# Patient Record
Sex: Female | Born: 1960 | Race: White | Hispanic: No | Smoking: Never smoker
Health system: Southern US, Community
[De-identification: ages and names within clinical notes are randomized; demographics above are authoritative.]

## PROBLEM LIST (undated history)

## (undated) DIAGNOSIS — Z9889 Other specified postprocedural states: Secondary | ICD-10-CM

## (undated) DIAGNOSIS — Z98811 Dental restoration status: Secondary | ICD-10-CM

## (undated) DIAGNOSIS — R112 Nausea with vomiting, unspecified: Secondary | ICD-10-CM

## (undated) DIAGNOSIS — T7840XA Allergy, unspecified, initial encounter: Secondary | ICD-10-CM

## (undated) DIAGNOSIS — R011 Cardiac murmur, unspecified: Secondary | ICD-10-CM

## (undated) DIAGNOSIS — K219 Gastro-esophageal reflux disease without esophagitis: Secondary | ICD-10-CM

## (undated) DIAGNOSIS — I341 Nonrheumatic mitral (valve) prolapse: Secondary | ICD-10-CM

## (undated) DIAGNOSIS — R0981 Nasal congestion: Secondary | ICD-10-CM

## (undated) DIAGNOSIS — J381 Polyp of vocal cord and larynx: Secondary | ICD-10-CM

## (undated) DIAGNOSIS — M5412 Radiculopathy, cervical region: Secondary | ICD-10-CM

## (undated) DIAGNOSIS — M1990 Unspecified osteoarthritis, unspecified site: Secondary | ICD-10-CM

## (undated) DIAGNOSIS — H01133 Eczematous dermatitis of right eye, unspecified eyelid: Secondary | ICD-10-CM

## (undated) HISTORY — DX: Nonrheumatic mitral (valve) prolapse: I34.1

## (undated) HISTORY — DX: Radiculopathy, cervical region: M54.12

## (undated) HISTORY — DX: Allergy, unspecified, initial encounter: T78.40XA

## (undated) HISTORY — DX: Gastro-esophageal reflux disease without esophagitis: K21.9

## (undated) HISTORY — DX: Cardiac murmur, unspecified: R01.1

## (undated) HISTORY — PX: FINGER SURGERY: SHX640

## (undated) HISTORY — DX: Unspecified osteoarthritis, unspecified site: M19.90

## (undated) HISTORY — PX: MOUTH SURGERY: SHX715

---

## 1987-01-28 HISTORY — PX: LUMBAR DISC SURGERY: SHX700

## 1999-03-13 ENCOUNTER — Inpatient Hospital Stay (HOSPITAL_COMMUNITY): Admission: AD | Admit: 1999-03-13 | Discharge: 1999-03-15 | Payer: Self-pay | Admitting: *Deleted

## 2000-09-04 ENCOUNTER — Ambulatory Visit (HOSPITAL_COMMUNITY): Admission: RE | Admit: 2000-09-04 | Discharge: 2000-09-04 | Payer: Self-pay | Admitting: *Deleted

## 2000-09-04 ENCOUNTER — Encounter: Payer: Self-pay | Admitting: *Deleted

## 2001-05-25 ENCOUNTER — Other Ambulatory Visit: Admission: RE | Admit: 2001-05-25 | Discharge: 2001-05-25 | Payer: Self-pay | Admitting: *Deleted

## 2003-10-09 ENCOUNTER — Ambulatory Visit (HOSPITAL_COMMUNITY): Admission: RE | Admit: 2003-10-09 | Discharge: 2003-10-09 | Payer: Self-pay | Admitting: Obstetrics & Gynecology

## 2003-11-07 ENCOUNTER — Encounter (INDEPENDENT_AMBULATORY_CARE_PROVIDER_SITE_OTHER): Payer: Self-pay | Admitting: *Deleted

## 2003-11-07 ENCOUNTER — Ambulatory Visit (HOSPITAL_BASED_OUTPATIENT_CLINIC_OR_DEPARTMENT_OTHER): Admission: RE | Admit: 2003-11-07 | Discharge: 2003-11-07 | Payer: Self-pay | Admitting: Otolaryngology

## 2003-11-07 ENCOUNTER — Ambulatory Visit (HOSPITAL_COMMUNITY): Admission: RE | Admit: 2003-11-07 | Discharge: 2003-11-07 | Payer: Self-pay | Admitting: Otolaryngology

## 2003-11-07 HISTORY — PX: MICROLARYNGOSCOPY: SHX5208

## 2004-03-22 ENCOUNTER — Ambulatory Visit: Payer: Self-pay | Admitting: Family Medicine

## 2004-11-06 ENCOUNTER — Ambulatory Visit: Payer: Self-pay | Admitting: Family Medicine

## 2004-12-27 ENCOUNTER — Encounter: Admission: RE | Admit: 2004-12-27 | Discharge: 2004-12-27 | Payer: Self-pay | Admitting: Obstetrics & Gynecology

## 2005-01-16 ENCOUNTER — Encounter: Admission: RE | Admit: 2005-01-16 | Discharge: 2005-01-16 | Payer: Self-pay | Admitting: Obstetrics & Gynecology

## 2005-04-17 ENCOUNTER — Ambulatory Visit: Payer: Self-pay | Admitting: Family Medicine

## 2005-10-01 ENCOUNTER — Ambulatory Visit: Payer: Self-pay | Admitting: Family Medicine

## 2005-12-12 ENCOUNTER — Ambulatory Visit: Payer: Self-pay | Admitting: Cardiology

## 2005-12-24 ENCOUNTER — Ambulatory Visit: Payer: Self-pay

## 2005-12-24 ENCOUNTER — Encounter: Payer: Self-pay | Admitting: Cardiovascular Disease

## 2006-01-22 ENCOUNTER — Encounter: Admission: RE | Admit: 2006-01-22 | Discharge: 2006-01-22 | Payer: Self-pay | Admitting: Obstetrics & Gynecology

## 2006-02-02 ENCOUNTER — Ambulatory Visit: Payer: Self-pay | Admitting: Family Medicine

## 2006-02-02 ENCOUNTER — Encounter: Admission: RE | Admit: 2006-02-02 | Discharge: 2006-02-02 | Payer: Self-pay | Admitting: Obstetrics & Gynecology

## 2006-02-09 ENCOUNTER — Ambulatory Visit: Payer: Self-pay | Admitting: Family Medicine

## 2006-02-25 ENCOUNTER — Ambulatory Visit: Payer: Self-pay | Admitting: Family Medicine

## 2006-06-30 ENCOUNTER — Ambulatory Visit: Payer: Self-pay | Admitting: Family Medicine

## 2006-06-30 DIAGNOSIS — M545 Low back pain: Secondary | ICD-10-CM

## 2006-06-30 DIAGNOSIS — Z8679 Personal history of other diseases of the circulatory system: Secondary | ICD-10-CM | POA: Insufficient documentation

## 2006-07-03 ENCOUNTER — Ambulatory Visit: Payer: Self-pay | Admitting: Family Medicine

## 2006-07-03 DIAGNOSIS — J309 Allergic rhinitis, unspecified: Secondary | ICD-10-CM | POA: Insufficient documentation

## 2006-08-03 ENCOUNTER — Encounter: Admission: RE | Admit: 2006-08-03 | Discharge: 2006-08-03 | Payer: Self-pay | Admitting: Obstetrics & Gynecology

## 2006-10-12 ENCOUNTER — Ambulatory Visit: Payer: Self-pay | Admitting: Family Medicine

## 2006-10-12 DIAGNOSIS — M79609 Pain in unspecified limb: Secondary | ICD-10-CM

## 2006-10-13 ENCOUNTER — Telehealth (INDEPENDENT_AMBULATORY_CARE_PROVIDER_SITE_OTHER): Payer: Self-pay | Admitting: *Deleted

## 2006-10-15 ENCOUNTER — Encounter: Payer: Self-pay | Admitting: Family Medicine

## 2006-12-02 ENCOUNTER — Ambulatory Visit: Payer: Self-pay | Admitting: Cardiology

## 2007-01-27 ENCOUNTER — Telehealth (INDEPENDENT_AMBULATORY_CARE_PROVIDER_SITE_OTHER): Payer: Self-pay | Admitting: *Deleted

## 2007-02-11 ENCOUNTER — Telehealth (INDEPENDENT_AMBULATORY_CARE_PROVIDER_SITE_OTHER): Payer: Self-pay | Admitting: *Deleted

## 2007-03-09 ENCOUNTER — Encounter: Admission: RE | Admit: 2007-03-09 | Discharge: 2007-03-09 | Payer: Self-pay | Admitting: Obstetrics & Gynecology

## 2008-03-08 ENCOUNTER — Ambulatory Visit: Payer: Self-pay | Admitting: Family Medicine

## 2008-03-08 DIAGNOSIS — M5412 Radiculopathy, cervical region: Secondary | ICD-10-CM | POA: Insufficient documentation

## 2008-03-08 DIAGNOSIS — M542 Cervicalgia: Secondary | ICD-10-CM

## 2008-03-16 ENCOUNTER — Encounter: Admission: RE | Admit: 2008-03-16 | Discharge: 2008-03-16 | Payer: Self-pay | Admitting: Obstetrics & Gynecology

## 2008-04-18 ENCOUNTER — Encounter: Admission: RE | Admit: 2008-04-18 | Discharge: 2008-04-18 | Payer: Self-pay | Admitting: Family Medicine

## 2008-04-24 ENCOUNTER — Telehealth (INDEPENDENT_AMBULATORY_CARE_PROVIDER_SITE_OTHER): Payer: Self-pay | Admitting: *Deleted

## 2008-04-24 ENCOUNTER — Encounter (INDEPENDENT_AMBULATORY_CARE_PROVIDER_SITE_OTHER): Payer: Self-pay | Admitting: *Deleted

## 2008-05-05 ENCOUNTER — Encounter (INDEPENDENT_AMBULATORY_CARE_PROVIDER_SITE_OTHER): Payer: Self-pay | Admitting: *Deleted

## 2009-03-26 ENCOUNTER — Encounter: Admission: RE | Admit: 2009-03-26 | Discharge: 2009-03-26 | Payer: Self-pay | Admitting: Obstetrics & Gynecology

## 2009-10-10 LAB — CONVERTED CEMR LAB: Pap Smear: NORMAL

## 2009-11-09 ENCOUNTER — Encounter: Payer: Self-pay | Admitting: Family Medicine

## 2009-11-09 ENCOUNTER — Ambulatory Visit: Payer: Self-pay | Admitting: Family Medicine

## 2009-11-09 ENCOUNTER — Encounter (INDEPENDENT_AMBULATORY_CARE_PROVIDER_SITE_OTHER): Payer: Self-pay | Admitting: *Deleted

## 2009-11-09 DIAGNOSIS — M1990 Unspecified osteoarthritis, unspecified site: Secondary | ICD-10-CM | POA: Insufficient documentation

## 2009-11-09 DIAGNOSIS — R5383 Other fatigue: Secondary | ICD-10-CM

## 2009-11-09 DIAGNOSIS — R5381 Other malaise: Secondary | ICD-10-CM | POA: Insufficient documentation

## 2009-11-12 ENCOUNTER — Encounter: Payer: Self-pay | Admitting: Family Medicine

## 2009-11-19 LAB — CONVERTED CEMR LAB
ALT: 10 units/L (ref 0–35)
AST: 18 units/L (ref 0–37)
Alkaline Phosphatase: 57 units/L (ref 39–117)
Basophils Relative: 0 % (ref 0–1)
CO2: 27 meq/L (ref 19–32)
Chloride: 101 meq/L (ref 96–112)
Creatinine, Ser: 0.89 mg/dL (ref 0.40–1.20)
Eosinophils Absolute: 0.1 10*3/uL (ref 0.0–0.7)
Folate: >20 ng/mL
Glucose, Bld: 82 mg/dL (ref 70–99)
HCT: 39.8 % (ref 36.0–46.0)
Lymphs Abs: 1.7 10*3/uL (ref 0.7–4.0)
MCV: 91.9 fL (ref 78.0–100.0)
Monocytes Relative: 8 % (ref 3–12)
Neutro Abs: 4.9 10*3/uL (ref 1.7–7.7)
Platelets: 196 10*3/uL (ref 150–400)
Potassium: 4.2 meq/L (ref 3.5–5.3)
RBC: 4.33 M/uL (ref 3.87–5.11)
RDW: 12.9 % (ref 11.5–15.5)
Sodium: 138 meq/L (ref 135–145)
Vit D, 25-Hydroxy: 39 ng/mL (ref 30–89)
Vitamin B-12: 709 pg/mL (ref 211–911)

## 2009-12-13 ENCOUNTER — Ambulatory Visit: Payer: Self-pay | Admitting: Cardiology

## 2009-12-13 ENCOUNTER — Encounter: Payer: Self-pay | Admitting: Family Medicine

## 2009-12-25 ENCOUNTER — Encounter: Payer: Self-pay | Admitting: Cardiology

## 2009-12-25 ENCOUNTER — Ambulatory Visit: Payer: Self-pay | Admitting: Internal Medicine

## 2009-12-25 ENCOUNTER — Ambulatory Visit (HOSPITAL_COMMUNITY): Admission: RE | Admit: 2009-12-25 | Discharge: 2009-12-25 | Payer: Self-pay | Admitting: Cardiology

## 2009-12-25 ENCOUNTER — Ambulatory Visit: Payer: Self-pay

## 2010-01-01 ENCOUNTER — Telehealth: Payer: Self-pay | Admitting: Cardiology

## 2010-02-16 ENCOUNTER — Encounter: Payer: Self-pay | Admitting: Obstetrics & Gynecology

## 2010-02-17 ENCOUNTER — Encounter: Payer: Self-pay | Admitting: Obstetrics & Gynecology

## 2010-02-28 NOTE — Letter (Signed)
Summary: [facility]: Immunoassay Fecal Occult Blood (iFOB) Order Form  [facility] [place]  [address] [place], [place] [address]   Phone: [phone]  Fax: [phone]      [facility]: Immunoassay Fecal Occult Blood (iFOB) Order Form   November 12, 2009 MRN: [id number]   Laura [name] Mar 18, 1960   Physicican Name: Laury Axon  Diagnosis Code: v70.0 and 780.79      Loreen Freud, DO

## 2010-02-28 NOTE — Assessment & Plan Note (Signed)
Summary: EC6/HX of MVP/Former pt Dr.Mcdowell-MB    Visit Type:  Initial Consult Primary Provider:  Dr. Laury Axon   History of Present Illness: 50 yo with history of mitral valve prolapse previously seen in the office by Dr. Diona Browner returns for cardiac evaluation.  She has been doing well in general.  She has occasional palpitations that have been present for a number of years with no change.  These have been thought to be due to premature beats.  She gets mildly short of breath if she walks fast up a flight of steps.  No shortness of breath walking on flat ground.  No chest pain.  Occasional vertigo-type symptoms.  Last echo in 2007 did not show any significant mitral regurgitation.    Labs (10/11): LDL 104, HDL 47, TSH normal, HCT 39.8  Current Medications (verified): 1)  Denavir 1 % Crea (Penciclovir) .... Apply A Small Amount To Affected Area As Directed 2)  Mvi .... Daily 3)  Advil .... By Mouth As Needed 4)  Glucosamine-Chondroitin  Caps (Glucosamine-Chondroit-Vit C-Mn) .... Daily  Allergies: 1)  ! Erythromycin  Past History:  Past Medical History: 1. Osteoarthritis 2. CERVICAL RADICULOPATHY (ICD-723.4) 3. RHINITIS, ALLERGIC NOS (ICD-477.9) 4. BACK PAIN, LUMBAR (ICD-724.2) 5. MITRAL VALVE PROLAPSE, HX OF (ICD-V12.50): last echo 11/07 with EF 55%, no significant mitral regurgitation.  6. Palpitations  Family History: Family History Breast Cancer FAmily History Diabetes Mother--tongue cancer Family History Thyroid disease Father with CAD in early 54s, CABG late 58s  Social History: Occupation: IT consultant Married, originally from [place].  Never Smoked Alcohol use-yes Drug use-no Regular exercise-yes  Review of Systems       All systems reviewed and negative except as per HPI.   Vital Signs:  Patient profile:   50 year old female Height:      68.5 inches Weight:      160 pounds BMI:     24.06 Pulse rate:   70 / minute BP sitting:   107 / 67   (left arm)  Vitals Entered By: Laurance Flatten CMA (December 13, 2009 8:52 AM)  Physical Exam  General:  Well developed, well nourished, in no acute distress. Head:  normocephalic and atraumatic Nose:  no deformity, discharge, inflammation, or lesions Mouth:  Teeth, gums and palate normal. Oral mucosa normal. Neck:  Neck supple, no JVD. No masses, thyromegaly or abnormal cervical nodes. Lungs:  Clear bilaterally to auscultation and percussion. Heart:  Non-displaced PMI, chest non-tender; regular rate and rhythm, S1, S2 without murmurs, rubs or gallops. Carotid upstroke normal, no bruit. Pedals normal pulses. No edema, no varicosities. Abdomen:  Bowel sounds positive; abdomen soft and non-tender without masses, organomegaly, or hernias noted. No hepatosplenomegaly. Extremities:  No clubbing or cyanosis. Neurologic:  Alert and oriented x 3. Psych:  Normal affect.   Impression & Recommendations:  Problem # 1:  PALPITATIONS Occasional mild palpitations, suspect premature beats.     Problem # 2:  MITRAL VALVE PROLAPSE, HX OF (ICD-V12.50) No significant murmur on exam.  Will get echo to followup MVP.   Follow in office in 3 years if echo unremarkable.   Other Orders: Echocardiogram (Echo)  Patient Instructions: 1)  Your physician has requested that you have an echocardiogram.  Echocardiography is a painless test that uses sound waves to create images of your heart. It provides your doctor with information about the size and shape of your heart and how well your heart's chambers and valves are working.  This procedure takes  approximately one hour. There are no restrictions for this procedure. 2)  Your physician wants you to follow-up in:  3  years with Dr Shirlee Latch.  You will receive a reminder letter in the mail two months in advance. If you don't receive a letter, please call our office to schedule the follow-up appointment.

## 2010-02-28 NOTE — Assessment & Plan Note (Signed)
Summary: CPX ---WILL  TRY TO FAST---NEEDS TSH CHECKED PER OB-GYN///SPH   Vital Signs:  Patient profile:   50 year old female Height:      68.5 inches Weight:      157.38 pounds (71.54 kg) BMI:     23.67 Temp:     98.4 degrees F (36.89 degrees C) oral BP sitting:   112 / 70  (right arm) Cuff size:   regular  Vitals Entered By: Lucious Groves CMA (November 09, 2009 1:07 PM) CC: Fasting CPX/no pap./kb Comments Patient notes that she has already had her pap and mammorgram for the year. Patient needs repeat TSH./kb   History of Present Illness: Pt here for cpe and labs.   No pap.   Pt c/o gaining a lot of weight this year.  TSH done at gyn office and it was a little low.   Preventive Screening-Counseling & Management  Alcohol-Tobacco     Alcohol drinks/day: <1     Smoking Status: never  Caffeine-Diet-Exercise     Caffeine use/day: 3     Does Patient Exercise: yes  Hep-HIV-STD-Contraception     Dental Visit-last 6 months yes     Dental Care Counseling: to seek dental care; no dental care within six months     SBE monthly: no     SBE Education/Counseling: to perform regular SBE      Sexual History:  currently monogamous and married.        Drug Use:  no.    Current Medications (verified): 1)  Denavir 1 % Crea (Penciclovir) .... Apply A Small Amount To Affected Area As Directed 2)  Mvi .... Daily 3)  Advil .... By Mouth As Needed 4)  Glucosamine-Chondroitin  Caps (Glucosamine-Chondroit-Vit C-Mn) .... Daily  Allergies (verified): 1)  ! 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 Eyes:  Denies blurring, discharge, double vision, eye irritation, eye pain, halos, itching, light sensitivity, red eye, vision loss-1 eye, and vision loss-both eyes; optho--q1y. ENT:  Denies decreased hearing, difficulty swallowing, ear discharge, earache, hoarseness, nasal congestion, nosebleeds, postnasal drainage, ringing in ears, sinus pressure, and sore throat. CV:  Denies bluish discoloration of lips or nails, chest pain or discomfort, difficulty breathing at night, difficulty breathing while lying down, fainting, fatigue, leg cramps with exertion, lightheadness, near fainting, palpitations, shortness of breath with exertion, swelling of feet, swelling of hands, and weight gain. Resp:  Denies chest discomfort, chest pain with inspiration, cough, coughing up blood, excessive snoring, hypersomnolence, morning headaches, pleuritic, shortness of breath, sputum productive, and wheezing. GI:  Denies abdominal pain, bloody stools, change in bowel habits, constipation, dark tarry stools, diarrhea, excessive appetite, gas, hemorrhoids, indigestion,  loss of appetite, and nausea. GU:  Denies abnormal vaginal bleeding, decreased libido, discharge, dysuria, genital sores, hematuria, incontinence, nocturia, urinary frequency, and urinary hesitancy. MS:  Complains of joint pain; denies joint redness, joint swelling, loss of strength, low  back pain, mid back pain, muscle aches, muscle , cramps, muscle weakness, stiffness, and thoracic pain. Derm:  Denies changes in color of skin, changes in nail beds, dryness, excessive perspiration, flushing, hair loss, insect bite(s), itching, lesion(s), poor wound healing, and rash. Neuro:  Denies brief paralysis, difficulty with concentration, disturbances in coordination, falling down, headaches, inability to speak, memory loss, numbness, poor balance, seizures, sensation of room spinning, tingling, tremors, visual disturbances, and weakness. Psych:  Denies alternate hallucination ( auditory/visual), anxiety, depression, easily angered, easily tearful, irritability, mental problems, panic attacks, sense of great danger, suicidal thoughts/plans, thoughts of violence, unusual visions or sounds, and thoughts /plans of harming others. Endo:  Denies cold intolerance, excessive hunger, excessive thirst, excessive urination, heat intolerance, polyuria, and weight change. Heme:  Denies abnormal bruising, bleeding, enlarge lymph nodes, fevers, pallor, and skin discoloration. Allergy:  Denies hives or rash, itching eyes, persistent infections, seasonal allergies, and sneezing.  Physical Exam  General:  Well-developed,well-nourished,in no acute distress; alert,appropriate and cooperative throughout examination Head:  Normocephalic and atraumatic without obvious abnormalities. No apparent alopecia or balding. Eyes:  pupils equal, pupils round, pupils reactive to light, and no injection.   Ears:  External ear exam shows no significant lesions or deformities.  Otoscopic examination reveals clear canals, tympanic membranes are intact bilaterally without bulging, retraction, inflammation or discharge. Hearing is grossly normal bilaterally. Nose:  External nasal examination shows no deformity or inflammation. Nasal mucosa are pink and moist without lesions or exudates. Mouth:  Oral mucosa and oropharynx without  lesions or exudates.  Teeth in good repair. Neck:  No deformities, masses, or tenderness noted.no carotid bruits.   Chest Wall:  No deformities, masses, or tenderness noted. Breasts:  gyn Lungs:  Normal respiratory effort, chest expands symmetrically. Lungs are clear to auscultation, no crackles or wheezes. Heart:  normal rate and no murmur.   Abdomen:  Bowel sounds positive,abdomen soft and non-tender without masses, organomegaly or hernias noted. Rectal:  gyn Genitalia:  gyn Msk:  normal ROM, no joint tenderness, no joint swelling, no joint warmth, no redness over joints, no joint deformities, no joint instability, and no crepitation.   Pulses:  R and L carotid,radial,femoral,dorsalis pedis and posterior tibial pulses are full and equal bilaterally Extremities:  No clubbing, cyanosis, edema, or deformity noted with normal full range of motion of all joints.   Neurologic:  No cranial nerve deficits noted. Station and gait are normal. Plantar reflexes are down-going bilaterally. DTRs are symmetrical throughout. Sensory, motor and coordinative functions appear intact. Skin:  Intact without suspicious lesions or rashes Cervical Nodes:  No lymphadenopathy noted Axillary Nodes:  No palpable lymphadenopathy Psych:  Cognition and judgment appear intact. Alert and cooperative with normal attention span and concentration. No apparent delusions, illusions, hallucinations   Impression & Recommendations:  Problem # 1:  PREVENTIVE HEALTH CARE (ICD-V70.0) check labs ghm utd Orders: Venipuncture (62952) Specimen Handling (84132) EKG w/ Interpretation (93000)  Problem # 2:  MALAISE AND FATIGUE (ICD-780.79)  Orders: T-Vitamin D (25-Hydroxy) (44010-27253) Specimen Handling (66440) EKG w/ Interpretation (93000)  Problem # 3:  MITRAL VALVE PROLAPSE, HX OF (ICD-V12.50) Assessment: Improved pt states she is due for cardio f/u Orders: Cardiology Referral (Cardiology) EKG w/ Interpretation  (93000)  Complete Medication List: 1)  Denavir 1 % Crea (Penciclovir) .... Apply a small amount to affected area as directed 2)  Mvi  .... Daily 3)  Advil  .... By mouth as needed 4)  Glucosamine-chondroitin Caps (Glucosamine-chondroit-vit c-mn) .... Daily  Other Orders: Tdap => 23yrs IM [id number]) Admin 1st Vaccine (60454)  Preventive Care Screening  Pap Smear:    Date:  09/27/2009    Results:  historical   Mammogram:    Date:  03/27/2009    Results:  historical    Flu Vaccine Result Date:  10/31/2009 Flu Vaccine Result:  given Flu Vaccine Next Due:  1 yr PAP Result Date:  10/10/2009 PAP Result:  normal PAP Next Due:  1 yr Mammogram Result Date:  04/11/2009 Mammogram Result:  normal Mammogram Next Due:  1 yr     Immunizations Administered:  Tetanus Vaccine:    Vaccine Type: Tdap    Site: right deltoid    Mfr: GlaxoSmithKline    Dose: 0.5 ml    Route: IM    Given by: Lucious Groves CMA    Exp. Date: 10/18/2011    Lot #: [id number]    VIS given: 12/15/07 version given November 09, 2009.

## 2010-02-28 NOTE — Letter (Signed)
Summary: Primary Care Consult Scheduled Letter  [facility] [place]  [address] [place], [place] [address]   Phone: [phone]  Fax: [phone]      11/09/2009 MRN: [id number]  KEYUNA CUTHRELL [address] [place], [place]  [address]    Dear Ms. Rini,    We have scheduled an appointment for you.  At the recommendation of Dr. Loreen Freud, we have scheduled you a consult with Dr. Marca Ancona on 11-29-2009 at 10:30am.  Their address is [address] floor, [place] [address]. The office phone number is [phone].  If this appointment day and time is not convenient for you, please feel free to call the office of the doctor you are being referred to at the number listed above and reschedule the appointment.    It is important for you to keep your scheduled appointments. We are here to make sure you are given good patient care.   Thank you,    Renee, Patient Care Coordinator [place] at [facility]

## 2010-02-28 NOTE — Progress Notes (Signed)
Summary: Pt returning call   Phone Note Call from Patient Call back at [phone]   Summary of Call: Pt returning call Initial call taken by: Judie Grieve,  January 01, 2010 1:16 PM  Follow-up for Phone Call        I notified pt of echo results done 12/25/09

## 2010-02-28 NOTE — Miscellaneous (Signed)
Summary: Appointment Canceled  Appointment status changed to canceled by [employer] on 12/13/2009 9:44 AM.  Cancellation Comments --------------------- echo dx  Appointment Information ----------------------- Appt Type:  CARDIOLOGY ANCILLARY VISIT      Date:  Monday, December 24, 2009      Time:  7:30 AM for 60 min   Urgency:  Routine   Made By:  Pearson Grippe  To Visit:  [id number]    Reason:  echo dx  Appt Comments ------------- -- 12/13/09 9:44: ([facility]) CANCELED -- echo dx -- 12/13/09 9:34: ([facility]) BOOKED -- Routine CARDIOLOGY ANCILLARY VISIT at 12/24/2009 7:30 AM for 60 min echo dx

## 2010-05-21 ENCOUNTER — Telehealth: Payer: Self-pay | Admitting: Family Medicine

## 2010-05-21 NOTE — Telephone Encounter (Signed)
Spoke w/ pt appt scheduled 

## 2010-05-21 NOTE — Telephone Encounter (Signed)
Patient has numbness in fingertips of right hand only (she is rt handed)---has pain that shoots down arm---thinks it might be carpal tunnel

## 2010-05-23 ENCOUNTER — Encounter: Payer: Self-pay | Admitting: Family Medicine

## 2010-05-23 ENCOUNTER — Ambulatory Visit (INDEPENDENT_AMBULATORY_CARE_PROVIDER_SITE_OTHER): Payer: 59 | Admitting: Family Medicine

## 2010-05-23 DIAGNOSIS — G5620 Lesion of ulnar nerve, unspecified upper limb: Secondary | ICD-10-CM

## 2010-05-23 NOTE — Assessment & Plan Note (Signed)
Pt will get wrist splint for wrist Get cushion for computer keyboard and under elbow To ortho if no better nsaids prn

## 2010-05-23 NOTE — Patient Instructions (Signed)
Ulnar Nerve Contusion with Rehab   The ulnar nerve lies near the surface of the skin as it passes by the elbow. This location causes it to be susceptible to injury. An ulnar nerve contusion is a bruise of the nerve. It is the result of direct trauma to the elbow.  Ulnar nerve contusions are characterized by pain, weakness, and loss of feeling in the hand.   SYMPTOMS  Signs of nerve damage include: tingling, numbness, weakness, and/or loss of feeling in the hand, specifically the little finger and ring finger.  Sharp pains that may shoot from the elbow to the wrist and hand.  Decreased hand function.  Tenderness and/ or inflammation in the elbow.  Muscle wasting (atrophy) in the hand.   CAUSES  Ulnar nerve contusions are caused by direct trauma to the elbow that results in bleeding which enters the nerve.   RISK INCREASES WITH.  Contact sports (football, soccer, or rugby).  Bleeding disorders.  Taking blood thinning medicine (warfarin [Coumadin], aspirin, or nonsteroidal anti-inflammatory medications).  Diabetes mellitus.  Underactive thyroid gland (hypothyroidism).   PREVENTIVE MEASURES   Wear properly fitted and padded protective equipment.  Only take blood thinning medication when necessary.    PROGNOSIS Ulnar nerve contusions usually heal within 6 weeks.  Healing often occurs spontaneously, but treatment helps reduce symptoms.    POSSIBLE COMPLICATIONS   Permanent nerve damage, including pain, numbness, tingling, or weakness in the hand (rare).  Weak grip.  Prolonged healing time, if improperly treated or re-injured.   GENERAL TREATMENT CONSIDERATIONS  Treatment initially involves resting from any activities that aggravate the symptoms, and the use of ice and medications to help reduce pain and inflammation. The use of strengthening and stretching exercises may help reduce pain with activity. These exercises may be performed at home or with referral to a  therapist. Your caregiver may recommend that you splint the elbow at night to help healing of the nerve. If symptoms persist despite conservative (non-surgical) treatment, then surgery may be recommended to free the nerve.   MEDICATION   If pain medication is necessary, then nonsteroidal anti-inflammatory medications, such as aspirin and ibuprofen, or other minor pain relievers, such as acetaminophen, are often recommended.   Do not take pain medication within 7 days before surgery.   Prescription pain relievers may be given if deemed necessary by your caregiver. Use only as directed and only as much as you need.   COLD THERAPY   Cold treatment (icing) relieves pain and reduces inflammation. Cold treatment should be applied for 10 to 15 minutes every 2 to 3 hours for inflammation and pain and immediately after any activity that aggravates your symptoms. Use ice packs or massage the area with a piece of ice (ice massage).   SEEK MEDICAL CARE IF:   Treatment seems to offer no benefit, or the condition worsens.  Any medications produce adverse side effects.   EXERCISES    RANGE OF MOTION AND STRETCHING EXERCISES - Ulnar Nerve Contusion These exercises may help you when beginning to rehabilitate your injury.  Do not begin these exercises until your physician, physical therapist or athletic trainer advises you to do so.  Discontinue any exercise that worsens your symptoms.  Contact your physician with instructions on how to continue. Your symptoms may resolve with or without further involvement from your physician, physical therapist or athletic trainer. While completing these exercises, remember:  Restoring tissue flexibility helps normal motion to return to the joints. This allows healthier, less  painful movement and activity.  An effective stretch should be held for at least 30 seconds.  A stretch should never be painful. You should only feel a gentle lengthening or release in the  stretched tissue.      RANGE OF MOTION - Extension  Hold your __________ arm at your side and straighten your elbow as far as you can using your __________ arm muscles.  Straighten the __________ elbow farther by gently pushing down on your forearm until you feel a gentle stretch on the inside of your elbow.  Hold this position for __________ seconds.  Slowly return to the starting position.  Repeat __________ times. Complete this exercise __________ times per day.     RANGE OF MOTION - Flexion  Hold your __________ arm at your side and bend your elbow as far as you can using your __________ arm muscles.  Bend the __________ elbow farther by gently pushing up on your forearm until you feel a gentle stretch on the outside of your elbow.  Hold this position for __________ seconds.  Slowly return to the starting position.  Repeat __________ times. Complete this exercise __________ times per day.      RANGE OF MOTION - Wrist Flexion, Active-Assisted  Extend your __________ elbow with your fingers pointing down.*   Gently pull the back of your hand towards you until you feel a gentle stretch on the top of your forearm.   Hold this position for __________ seconds.  Repeat __________ times. Complete this exercise __________ times per day.    *If directed by your physician, physical therapist or athletic trainer, complete this stretch with your elbow bent rather than extended.    RANGE OF MOTION - Wrist Extension, Active-Assisted   Extend your __________ elbow and turn your palm upwards.*  Gently pull your palm/fingertips back so your wrist extends and your fingers point more toward the ground.    You should feel a gentle stretch on the inside of your forearm.  Hold this position for __________ seconds.  Repeat __________ times. Complete this exercise __________ times per day. *If directed by your physician, physical therapist or athletic trainer, complete this stretch with your  elbow bent, rather than extended.     RANGE OF MOTION - Supination, Active   Stand or sit with your elbows at your side.  Bend your __________ elbow to 90 degrees.  Turn your palm upward until you feel a gentle stretch on the inside of your forearm.   Hold this position for __________ seconds. Slowly release and return to the starting position.  Repeat __________ times. Complete this stretch __________ times per day.      RANGE OF MOTION - Pronation, Active   Stand or sit with your elbows at your side. Bend your __________ elbow to 90 degrees.  Turn your palm downward until you feel a gentle stretch on the top of your forearm.   Hold this position for __________ seconds. Slowly release and return to the starting position.  Repeat __________ times. Complete this stretch __________ times per day.      STRETCH - Wrist Flexion   Place the back of your __________ hand on a tabletop leaving your elbow slightly bent. Your fingers should point away from your body.  Gently press the back of your hand down onto the table by straightening your elbow. You should feel a stretch on the top of your forearm.   Hold this position for __________ seconds.  Repeat __________ times. Complete  this stretch __________ times per day.      STRETCH - Wrist Extension   Place your __________ fingertips on a tabletop leaving your elbow slightly bent. Your fingers should point backwards.  Gently press your fingers and palm down onto the table by straightening your elbow.  You should feel a stretch on the inside of your forearm.   Hold this position for __________ seconds.  Repeat __________ times. Complete this stretch __________ times per day.      STRENGTHENING EXERCISES - Ulnar Nerve Contusion These exercises may help you when beginning to rehabilitate your injury. Do not begin these exercises until your physician, physical therapist or athletic trainer advises you to do so.  Discontinue any exercise  that worsens your symptoms. Contact your physician for instructions on how to continue.  They may resolve your symptoms with or without further involvement from your physician, physical therapist or athletic trainer. While completing these exercises, remember:   Muscles can gain both the endurance and the strength needed for everyday activities through controlled exercises.  Complete these exercises as instructed by your physician, physical therapist or athletic trainer.  Progress with the resistance and repetition exercises only as your caregiver advises.     STRENGTH - Wrist Flexors  Sit with your __________ forearm palm-up and fully supported. Your elbow should be resting below the height of your shoulder. Allow your wrist to extend over the edge of the surface.   Loosely holding a __________ pound weight or a piece of rubber exercise band/tubing, slowly curl your hand up toward your forearm.    Hold this position for __________ seconds. Slowly lower the wrist back to the starting position in a controlled manner. Repeat __________ times. Complete this exercise __________ times per day.      STRENGTH - Wrist Extensors  Sit with your __________ forearm palm-down and fully supported. Your elbow should be resting below the height of your shoulder. Allow your wrist to extend over the edge of the surface.  Loosely holding a __________ pound weight or a piece of rubber exercise band/tubing, slowly curl your hand up toward your forearm.    Hold this position for __________ seconds. Slowly lower the wrist back to the starting position in a controlled manner. Repeat __________ times. Complete this exercise __________ times per day.     STRENGTH - Ulnar Deviators  Stand with a ____________________ pound weight in your __________ hand or sit holding on to the rubber exercise band/tubing with your opposite arm supported.  Move your wrist so that your pinkie travels toward your forearm and your  thumb moves away from your forearm.  Hold this position for __________ seconds and then slowly lower the wrist back to the starting position. Repeat __________ times. Complete this exercise __________ times per day     STRENGTH - Radial Deviators  Stand with a __________ pound weight in your __________ hand or sit holding on to the rubber exercise band/tubing with your arm supported.  Raise your hand upward in front of you or pull up on the rubber tubing.  Hold this position for __________ seconds and then slowly lower the wrist back to the starting position.  Repeat __________ times. Complete this exercise __________ times per day.    STRENGTH - Grip   Grasp a tennis ball, a dense sponge, or a large, rolled sock in your hand.  Squeeze as hard as you can without increasing any pain.  Hold this position for __________ seconds. Release your grip  slowly. Repeat __________ times. Complete this exercise __________ times per day.    Document Released: 01/13/2005  Document Re-Released: 11/10/2008 [facility] Patient Information 2011 [place], [place].

## 2010-05-23 NOTE — Progress Notes (Signed)
  Subjective:    Patient ID: Laura [name], female    DOB: 07/30/1960, 50 y.o.   MRN: [id number]  HPI Pt here c/o numbness in R forearm and hand x several weeks.  Pt works at [employer] and arm rests on desk. No other symptoms.   Review of Systems As above    Objective:   Physical Exam  Constitutional: She appears well-developed and well-nourished.  Musculoskeletal: Normal range of motion. She exhibits tenderness.  Neurological: A sensory deficit is present.       + numbness and tingling in all fingers R hand No pain in elbow          Assessment & Plan:

## 2010-05-24 ENCOUNTER — Other Ambulatory Visit (HOSPITAL_COMMUNITY): Payer: Self-pay | Admitting: Obstetrics & Gynecology

## 2010-05-24 DIAGNOSIS — Z1231 Encounter for screening mammogram for malignant neoplasm of breast: Secondary | ICD-10-CM

## 2010-06-11 NOTE — Assessment & Plan Note (Signed)
[name]                            CARDIOLOGY OFFICE NOTE   DANIYAH, FOHL                     MRN:          [id number]  DATE:12/02/2006                            DOB:          1960/05/08    PRIMARY CARE PHYSICIAN:  Lelon Perla, D.O.   REASON FOR VISIT:  Follow up palpitations and history of mitral valve  prolapse.   HISTORY OF PRESENT ILLNESS:  [name] comes in for an annual visit.  She has a previous history of mild mitral valve prolapse with mild  mitral regurgitation and associated palpitations.  Following her last  visit, I arranged a followup echocardiogram which actually demonstrated  normal left ventricular systolic function with no significant evidence  of mitral valve prolapse nor any significant mitral regurgitation.  She  reports being very stable since her last visit, having only occasional  palpitations that are not particularly bothersome to her.  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  Her  electrocardiogram today shows sinus rhythm with decreased R wave  progression anteriorly which is similar to the prior tracing.  She is  not reporting any exertional chest pain, breathlessness, or syncope.   ALLERGIES:  ERYTHROMYCIN.   PRESENT MEDICATIONS:  Glucosamine chondroitin, p.r.n. Advil.   REVIEW OF SYSTEMS:  As described in the history of present illness.  Otherwise negative.   PHYSICAL EXAMINATION:  VITAL SIGNS:  Blood pressure 100/62, heart rate  60.  Weight is 151 pounds.  GENERAL:  This is a normal-nourished woman in no acute distress.  NECK:  No elevated jugular venous pressure, no loud bruits.  CHEST:  Clear without labored breathing.  CARDIOVASCULAR:  Regular rate and rhythm.  Very soft midsystolic click  without significant murmur.  No S3 gallop.  EXTREMITIES:  No pitting edema.   IMPRESSION AND RECOMMENDATIONS:  1. History of mild mitral valve prolapse,  although this was not      specifically documented on her followup echocardiogram in 2007.      She has had no change in her frequency of occasional palpitations.      At this point, we discussed going to a 2-year followup visit unless      she      develops any progressive symptoms.  2. Otherwise, continue regular follow up with Dr. Laury Axon.     Jonelle Sidle, MD  Electronically Signed    SGM/MedQ  DD: 12/02/2006  DT: 12/03/2006  Job #: [id number]   cc:   Lelon Perla, DO

## 2010-06-14 NOTE — Op Note (Signed)
NAME:  Laura [name], Laura [name] NO.:  [id number]   MEDICAL RECORD NO.:  [id number]          PATIENT TYPE:  AMB   LOCATION:  [facility]                          FACILITY:  [facility]   PHYSICIAN:  Christopher E. Ezzard Standing, M.D.DATE OF BIRTH:  October 25, 1960   DATE OF PROCEDURE:  11/07/2003  DATE OF DISCHARGE:                                 OPERATIVE REPORT   PREOPERATIVE DIAGNOSIS:  Left vocal cord cyst.   POSTOPERATIVE DIAGNOSIS:  Left vocal cord cyst.   OPERATION:  Microlaryngoscopy with excision of left true vocal cord cyst.   SURGEON:  Kristine Garbe. Ezzard Standing, M.D.   ANESTHESIA:  General endotracheal.   COMPLICATIONS:  None.   BRIEF CLINICAL NOTE:  [name] is a 50 year old female who has had  hoarseness for about a month and a half now.  It tends to get worse toward  the end of the day.  On exam in the office, she has a nodular cyst arising  from the mid-anterior left true vocal cord.  She is taken to the operating  room at this time for microlaryngoscopy and excision of left vocal cord  nodule.   DESCRIPTION OF PROCEDURE:  After adequate endotracheal anesthesia, direct  laryngoscopy was performed.  Using the anterior commissure laryngoscope, the  vocal cords were visualized.  Photos were obtained of the cyst in the mid-  left anterior true vocal cord.  After obtaining photos using a small cup  forceps and the knife, the cyst was excised and sent to pathology.  Hemostasis was attained with adrenalin-soaked cotton pledgets.  After  excising the cyst, the right true vocal cord was clear, the piriform sinuses  were clear, base of tongue, epiglottis were normal to evaluation.  This  completed the procedure.  [name] was awoken from anesthesia and transferred  to the recovery room postop doing well.   DISPOSITION:  [name] is discharged home later this morning.  We will have  her continue with her Protonix for another two weeks and gave her Tylenol  and Tylenol No. 3 p.r.n. pain.   We will have her follow up in my office in  one week for recheck and review pathology.  She is instructed on voice rest  for the next week.  Will follow up in one week for recheck.      CEN/MEDQ  D:  11/07/2003  T:  11/07/2003  Job:  [id number]   cc:   Angelena Sole, M.D. [facility]

## 2010-06-14 NOTE — Assessment & Plan Note (Signed)
[facility]                              CARDIOLOGY OFFICE NOTE   SHALICIA, Laura [name]                     MRN:          [id number]  DATE:12/12/2005                            DOB:          [birth year]    PRIMARY CARE PHYSICIAN:  Lelon Perla, DO.   REASON FOR VISIT:  Followup mitral valve prolapse.   HISTORY OF PRESENT ILLNESS:  This is my first meeting with Laura [name]. She  was previously followed by Dr. Andee Lineman with a history of palpitations  secondary to documented premature ventricular complexes in the setting of  mild mitral valve prolapse with mild mitral regurgitation. She states that  over the last few years, she has actually had an improvement in her  palpitations. She has had no problems with syncope, chest pain or dyspnea on  exertion. Her electrocardiogram today showed sinus rhythm with a leftward  axis, decreased R wave progression anteriorly  but no marked changes  compared to her prior tracing from 2004. Her last echocardiogram was in 2004  and Dr. Andee Lineman had anticipated yearly physical examination with an echo down  the road.   ALLERGIES:  ERYTHROMYCIN.   MEDICATIONS:  None chronically.   REVIEW OF SYSTEMS:  As described in the history of present illness.   PHYSICAL EXAMINATION:  VITAL SIGNS:  Blood pressure 106/64, heart rate 68,  weight 149 pounds which is stable.  GENERAL:  This is a thin woman in no acute distress.  HEENT:  Conjunctiva is normal, oropharynx is clear.  NECK:  Supple without jugular venous pressure and without bruits. No  thyromegaly is noted.  LUNGS:  Clear without labored breathing at rest.  CARDIAC:  Reveals a regular rate and rhythm with a very soft mid systolic  click heard mainly when standing. No loud murmur. No S3 gallop.  EXTREMITIES:  No significant pitting edema. Skin warm and dry. No clubbing,  cyanosis or edema. Pulses 2+.  MUSCULOSKELETAL:  No kyphosis is noted.  NEUROPSYCHIATRIC:  The  patient is alert and oriented x3. Affect is normal.   IMPRESSION/RECOMMENDATIONS:  1. History of mitral valve prolapse with mild mitral regurgitation and      previously documented premature ventricular complexes. Symptomatically      she is stable and we will plan a followup 2-D echocardiogram given that      her last study was in 2001. If this is stable, I would anticipate      yearly followup of symptoms and an echocardiogram down the road. Recent      SBE prophylaxis guidelines would argue against a need for continued      endocarditis prophylaxis at this point assuming her valve is stable.      She is not bothered by palpitations significantly and at this point      would continue to hold off on any sort of medical therapy for      this such as beta blockers.  2. Further plans to follow.     Jonelle Sidle, MD  Electronically Signed    SGM/MedQ  DD: 12/12/2005  DT: 12/12/2005  Job #: [id number]   cc:   Lelon Perla, DO

## 2010-06-28 ENCOUNTER — Other Ambulatory Visit: Payer: Self-pay | Admitting: Obstetrics & Gynecology

## 2010-06-28 ENCOUNTER — Ambulatory Visit (HOSPITAL_COMMUNITY): Payer: 59

## 2010-06-28 DIAGNOSIS — Z1231 Encounter for screening mammogram for malignant neoplasm of breast: Secondary | ICD-10-CM

## 2010-07-01 ENCOUNTER — Ambulatory Visit
Admission: RE | Admit: 2010-07-01 | Discharge: 2010-07-01 | Disposition: A | Discharge status: Home / Self Care | Payer: 59 | Source: Ambulatory Visit | Attending: Obstetrics & Gynecology | Admitting: Obstetrics & Gynecology

## 2010-07-01 DIAGNOSIS — Z1231 Encounter for screening mammogram for malignant neoplasm of breast: Secondary | ICD-10-CM

## 2010-10-31 ENCOUNTER — Encounter (INDEPENDENT_AMBULATORY_CARE_PROVIDER_SITE_OTHER): Payer: Self-pay | Admitting: General Surgery

## 2010-11-04 ENCOUNTER — Encounter (INDEPENDENT_AMBULATORY_CARE_PROVIDER_SITE_OTHER): Payer: Self-pay | Admitting: General Surgery

## 2010-11-04 ENCOUNTER — Ambulatory Visit (INDEPENDENT_AMBULATORY_CARE_PROVIDER_SITE_OTHER): Payer: 59 | Admitting: General Surgery

## 2010-11-04 VITALS — BP 114/76 | HR 60 | Temp 97.6°F | Resp 18 | Ht 68.5 in | Wt 162.1 lb

## 2010-11-04 DIAGNOSIS — K644 Residual hemorrhoidal skin tags: Secondary | ICD-10-CM | POA: Insufficient documentation

## 2010-11-04 NOTE — Patient Instructions (Addendum)
Stop using wet wipes to clean yourself.Call if you would like to have a hemorrhoidectomy.

## 2010-11-04 NOTE — Progress Notes (Signed)
Chief Complaint  Patient presents with  . Other    new pt eval of hemorrhoids     HPI Laura [name] is a 50 y.o. female.   HPI She is referred by [facility] for evaluation of symptomatic external hemorrhoids. She has had hemorrhoids for 17-1/2 years. In January of this year she began having increasing pain and itching from these hemorrhoids. She's tried multiple creams as well as soaks. This has not resolved the problem. She is most symptomatic when she has to strain to have a bowel movement or if she has a forceful loose bowel movements. Minimal if any bleeding is present.  Past Medical History  Diagnosis Date  . Osteoarthritis   . MVP (mitral valve prolapse)   . Allergy   . Rectal pain   . Heart murmur     mitral valve prolapse  . Cervical radiculopathy     Past Surgical History  Procedure Date  . Breast surgery   . Lumbar disc surgery 1989    percutaneous L5  . Mouth surgery     root canal, wisdom teeth, tissue between front teeth removed   . Larynx surgery 2002    removal of growth on larynx  . 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 Breast cancer    . Diabetes    . Tongue cancer Mother   . Thyroid disease    . Coronary artery disease Father   . Heart disease Father 5    heart failure     Social History History  Substance Use Topics  . Smoking status: Never Smoker   . Smokeless tobacco: Never Used  . Alcohol Use: 0.0 oz/week    2-3 drink(s) per week    Allergies  Allergen Reactions  . Erythromycin     REACTION: GI SENSITIVITY    Current Outpatient Prescriptions  Medication Sig Dispense Refill  . Calcium Carbonate-Vitamin D (CALCIUM + D PO) Take by mouth.        Marland Kitchen glucosamine-chondroitin 500-400 MG tablet Take 1 tablet by mouth daily.        . Ibuprofen (ADVIL) 200 MG CAPS Take by mouth.        . Multiple Vitamin (MULTIVITAMIN) tablet Take 1 tablet by mouth daily.           Review of Systems Review of Systems  Constitutional: Negative.   Cardiovascular: Negative.   Gastrointestinal:       She does have intermittent episodes of explosive diarrhea. Sometimes have some constipation.  Musculoskeletal: Positive for arthralgias.    Blood pressure 114/76, pulse 60, temperature 97.6 F (36.4 C), resp. rate 18, height 5' 8.5" (1.74 m), weight 162 lb 2 oz (73.539 kg).  Physical Exam Physical Exam  Constitutional: She appears well-developed and well-nourished. No distress.  Genitourinary:       External hemorrhoid is in the right posterior position. Small external hemorrhoid in the left lateral position. No fissure is present. No perianal rashes. On digital rectal exam, no masses are felt; no blood is present.  Anoscopy-very small internal hemorrhoids are seen in the right posterior, right anterior, and left lateral positions.    Data Reviewed Dr. Sharol Roussel office note.  Assessment    Symptomatic external hemorrhoids. She's does use wet wipe to clean herself. No significant internal hemorrhoid disease.    Plan    I suggested she stopped using the wet  wipes. Using very soft tissue paper. If her symptoms persist and she would like to have hemorrhoidectomy I told her to call us back and we can schedule this. We have discussed the procedure and risks. Risks include but not limited to bleeding, infection, wound healing problems, anesthesia, incontinence, recurrent hemorrhoidal disease.       Carmencita Cusic J 11/04/2010, 4:53 PM

## 2011-04-11 ENCOUNTER — Ambulatory Visit: Payer: 59 | Admitting: Family Medicine

## 2011-06-03 ENCOUNTER — Encounter: Payer: Self-pay | Admitting: Family Medicine

## 2011-06-03 ENCOUNTER — Ambulatory Visit (INDEPENDENT_AMBULATORY_CARE_PROVIDER_SITE_OTHER): Payer: 59 | Admitting: Family Medicine

## 2011-06-03 VITALS — BP 108/70 | HR 62 | Temp 98.6°F | Ht 68.5 in | Wt 155.0 lb

## 2011-06-03 DIAGNOSIS — Z0000 Encounter for general adult medical examination without abnormal findings: Secondary | ICD-10-CM

## 2011-06-03 DIAGNOSIS — M1990 Unspecified osteoarthritis, unspecified site: Secondary | ICD-10-CM

## 2011-06-03 DIAGNOSIS — Z8679 Personal history of other diseases of the circulatory system: Secondary | ICD-10-CM

## 2011-06-03 NOTE — Assessment & Plan Note (Signed)
Refer to rheum 

## 2011-06-03 NOTE — Patient Instructions (Signed)
Preventive Care for Adults, Female A healthy lifestyle and preventive care can promote health and wellness. Preventive health guidelines for women include the following key practices.  A routine yearly physical is a good way to check with your caregiver about your health and preventive screening. It is a chance to share any concerns and updates on your health, and to receive a thorough exam.   Visit your dentist for a routine exam and preventive care every 6 months. Brush your teeth twice a day and floss once a day. Good oral hygiene prevents tooth decay and gum disease.   The frequency of eye exams is based on your age, health, family medical history, use of contact lenses, and other factors. Follow your caregiver's recommendations for frequency of eye exams.   Eat a healthy diet. Foods like vegetables, fruits, whole grains, low-fat dairy products, and lean protein foods contain the nutrients you need without too many calories. Decrease your intake of foods high in solid fats, added sugars, and salt. Eat the right amount of calories for you.Get information about a proper diet from your caregiver, if necessary.   Regular physical exercise is one of the most important things you can do for your health. Most adults should get at least 150 minutes of moderate-intensity exercise (any activity that increases your heart rate and causes you to sweat) each week. In addition, most adults need muscle-strengthening exercises on 2 or more days a week.   Maintain a healthy weight. The body mass index (BMI) is a screening tool to identify possible weight problems. It provides an estimate of body fat based on height and weight. Your caregiver can help determine your BMI, and can help you achieve or maintain a healthy weight.For adults 20 years and older:   A BMI below 18.5 is considered underweight.   A BMI of 18.5 to 24.9 is normal.   A BMI of 25 to 29.9 is considered overweight.   A BMI of 30 and above is  considered obese.   Maintain normal blood lipids and cholesterol levels by exercising and minimizing your intake of saturated fat. Eat a balanced diet with plenty of fruit and vegetables. Blood tests for lipids and cholesterol should begin at age 20 and be repeated every 5 years. If your lipid or cholesterol levels are high, you are over 50, or you are at high risk for heart disease, you may need your cholesterol levels checked more frequently.Ongoing high lipid and cholesterol levels should be treated with medicines if diet and exercise are not effective.   If you smoke, find out from your caregiver how to quit. If you do not use tobacco, do not start.   If you are pregnant, do not drink alcohol. If you are breastfeeding, be very cautious about drinking alcohol. If you are not pregnant and choose to drink alcohol, do not exceed 1 drink per day. One drink is considered to be 12 ounces (355 mL) of beer, 5 ounces (148 mL) of wine, or 1.5 ounces (44 mL) of liquor.   Avoid use of street drugs. Do not share needles with anyone. Ask for help if you need support or instructions about stopping the use of drugs.   High blood pressure causes heart disease and increases the risk of stroke. Your blood pressure should be checked at least every 1 to 2 years. Ongoing high blood pressure should be treated with medicines if weight loss and exercise are not effective.   If you are 55 to 49   years old, ask your caregiver if you should take aspirin to prevent strokes.   Diabetes screening involves taking a blood sample to check your fasting blood sugar level. This should be done once every 3 years, after age 45, if you are within normal weight and without risk factors for diabetes. Testing should be considered at a younger age or be carried out more frequently if you are overweight and have at least 1 risk factor for diabetes.   Breast cancer screening is essential preventive care for women. You should practice "breast  self-awareness." This means understanding the normal appearance and feel of your breasts and may include breast self-examination. Any changes detected, no matter how small, should be reported to a caregiver. Women in their 20s and 30s should have a clinical breast exam (CBE) by a caregiver as part of a regular health exam every 1 to 3 years. After age 40, women should have a CBE every year. Starting at age 40, women should consider having a mammography (breast X-ray test) every year. Women who have a family history of breast cancer should talk to their caregiver about genetic screening. Women at a high risk of breast cancer should talk to their caregivers about having magnetic resonance imaging (MRI) and a mammography every year.   The Pap test is a screening test for cervical cancer. A Pap test can show cell changes on the cervix that might become cervical cancer if left untreated. A Pap test is a procedure in which cells are obtained and examined from the lower end of the uterus (cervix).   Women should have a Pap test starting at age 21.   Between ages 21 and 29, Pap tests should be repeated every 2 years.   Beginning at age 30, you should have a Pap test every 3 years as long as the past 3 Pap tests have been normal.   Some women have medical problems that increase the chance of getting cervical cancer. Talk to your caregiver about these problems. It is especially important to talk to your caregiver if a new problem develops soon after your last Pap test. In these cases, your caregiver may recommend more frequent screening and Pap tests.   The above recommendations are the same for women who have or have not gotten the vaccine for human papillomavirus (HPV).   If you had a hysterectomy for a problem that was not cancer or a condition that could lead to cancer, then you no longer need Pap tests. Even if you no longer need a Pap test, a regular exam is a good idea to make sure no other problems are  starting.   If you are between ages 65 and 70, and you have had normal Pap tests going back 10 years, you no longer need Pap tests. Even if you no longer need a Pap test, a regular exam is a good idea to make sure no other problems are starting.   If you have had past treatment for cervical cancer or a condition that could lead to cancer, you need Pap tests and screening for cancer for at least 20 years after your treatment.   If Pap tests have been discontinued, risk factors (such as a new sexual partner) need to be reassessed to determine if screening should be resumed.   The HPV test is an additional test that may be used for cervical cancer screening. The HPV test looks for the virus that can cause the cell changes on the cervix.   The cells collected during the Pap test can be tested for HPV. The HPV test could be used to screen women aged 30 years and older, and should be used in women of any age who have unclear Pap test results. After the age of 30, women should have HPV testing at the same frequency as a Pap test.   Colorectal cancer can be detected and often prevented. Most routine colorectal cancer screening begins at the age of 50 and continues through age 75. However, your caregiver may recommend screening at an earlier age if you have risk factors for colon cancer. On a yearly basis, your caregiver may provide home test kits to check for hidden blood in the stool. Use of a small camera at the end of a tube, to directly examine the colon (sigmoidoscopy or colonoscopy), can detect the earliest forms of colorectal cancer. Talk to your caregiver about this at age 50, when routine screening begins. Direct examination of the colon should be repeated every 5 to 10 years through age 75, unless early forms of pre-cancerous polyps or small growths are found.   Hepatitis C blood testing is recommended for all people born from 1945 through 1965 and any individual with known risks for hepatitis C.    Practice safe sex. Use condoms and avoid high-risk sexual practices to reduce the spread of sexually transmitted infections (STIs). STIs include gonorrhea, chlamydia, syphilis, trichomonas, herpes, HPV, and human immunodeficiency virus (HIV). Herpes, HIV, and HPV are viral illnesses that have no cure. They can result in disability, cancer, and death. Sexually active women aged 25 and younger should be checked for chlamydia. Older women with new or multiple partners should also be tested for chlamydia. Testing for other STIs is recommended if you are sexually active and at increased risk.   Osteoporosis is a disease in which the bones lose minerals and strength with aging. This can result in serious bone fractures. The risk of osteoporosis can be identified using a bone density scan. Women ages 65 and over and women at risk for fractures or osteoporosis should discuss screening with their caregivers. Ask your caregiver whether you should take a calcium supplement or vitamin D to reduce the rate of osteoporosis.   Menopause can be associated with physical symptoms and risks. Hormone replacement therapy is available to decrease symptoms and risks. You should talk to your caregiver about whether hormone replacement therapy is right for you.   Use sunscreen with sun protection factor (SPF) of 30 or more. Apply sunscreen liberally and repeatedly throughout the day. You should seek shade when your shadow is shorter than you. Protect yourself by wearing long sleeves, pants, a wide-brimmed hat, and sunglasses year round, whenever you are outdoors.   Once a month, do a whole body skin exam, using a mirror to look at the skin on your back. Notify your caregiver of new moles, moles that have irregular borders, moles that are larger than a pencil eraser, or moles that have changed in shape or color.   Stay current with required immunizations.   Influenza. You need a dose every fall (or winter). The composition of  the flu vaccine changes each year, so being vaccinated once is not enough.   Pneumococcal polysaccharide. You need 1 to 2 doses if you smoke cigarettes or if you have certain chronic medical conditions. You need 1 dose at age 65 (or older) if you have never been vaccinated.   Tetanus, diphtheria, pertussis (Tdap, Td). Get 1 dose of   Tdap vaccine if you are younger than age 65, are over 65 and have contact with an infant, are a healthcare worker, are pregnant, or simply want to be protected from whooping cough. After that, you need a Td booster dose every 10 years. Consult your caregiver if you have not had at least 3 tetanus and diphtheria-containing shots sometime in your life or have a deep or dirty wound.   HPV. You need this vaccine if you are a woman age 26 or younger. The vaccine is given in 3 doses over 6 months.   Measles, mumps, rubella (MMR). You need at least 1 dose of MMR if you were born in 1957 or later. You may also need a second dose.   Meningococcal. If you are age 19 to 21 and a first-year college student living in a residence hall, or have one of several medical conditions, you need to get vaccinated against meningococcal disease. You may also need additional booster doses.   Zoster (shingles). If you are age 60 or older, you should get this vaccine.   Varicella (chickenpox). If you have never had chickenpox or you were vaccinated but received only 1 dose, talk to your caregiver to find out if you need this vaccine.   Hepatitis A. You need this vaccine if you have a specific risk factor for hepatitis A virus infection or you simply wish to be protected from this disease. The vaccine is usually given as 2 doses, 6 to 18 months apart.   Hepatitis B. You need this vaccine if you have a specific risk factor for hepatitis B virus infection or you simply wish to be protected from this disease. The vaccine is given in 3 doses, usually over 6 months.  Preventive Services /  Frequency Ages 19 to 39  Blood pressure check.** / Every 1 to 2 years.   Lipid and cholesterol check.** / Every 5 years beginning at age 20.   Clinical breast exam.** / Every 3 years for women in their 20s and 30s.   Pap test.** / Every 2 years from ages 21 through 29. Every 3 years starting at age 30 through age 65 or 70 with a history of 3 consecutive normal Pap tests.   HPV screening.** / Every 3 years from ages 30 through ages 65 to 70 with a history of 3 consecutive normal Pap tests.   Hepatitis C blood test.** / For any individual with known risks for hepatitis C.   Skin self-exam. / Monthly.   Influenza immunization.** / Every year.   Pneumococcal polysaccharide immunization.** / 1 to 2 doses if you smoke cigarettes or if you have certain chronic medical conditions.   Tetanus, diphtheria, pertussis (Tdap, Td) immunization. / A one-time dose of Tdap vaccine. After that, you need a Td booster dose every 10 years.   HPV immunization. / 3 doses over 6 months, if you are 26 and younger.   Measles, mumps, rubella (MMR) immunization. / You need at least 1 dose of MMR if you were born in 1957 or later. You may also need a second dose.   Meningococcal immunization. / 1 dose if you are age 19 to 21 and a first-year college student living in a residence hall, or have one of several medical conditions, you need to get vaccinated against meningococcal disease. You may also need additional booster doses.   Varicella immunization.** / Consult your caregiver.   Hepatitis A immunization.** / Consult your caregiver. 2 doses, 6 to 18 months   apart.   Hepatitis B immunization.** / Consult your caregiver. 3 doses usually over 6 months.  Ages 40 to 64  Blood pressure check.** / Every 1 to 2 years.   Lipid and cholesterol check.** / Every 5 years beginning at age 20.   Clinical breast exam.** / Every year after age 40.   Mammogram.** / Every year beginning at age 40 and continuing for as  long as you are in good health. Consult with your caregiver.   Pap test.** / Every 3 years starting at age 30 through age 65 or 70 with a history of 3 consecutive normal Pap tests.   HPV screening.** / Every 3 years from ages 30 through ages 65 to 70 with a history of 3 consecutive normal Pap tests.   Fecal occult blood test (FOBT) of stool. / Every year beginning at age 50 and continuing until age 75. You may not need to do this test if you get a colonoscopy every 10 years.   Flexible sigmoidoscopy or colonoscopy.** / Every 5 years for a flexible sigmoidoscopy or every 10 years for a colonoscopy beginning at age 50 and continuing until age 75.   Hepatitis C blood test.** / For all people born from 1945 through 1965 and any individual with known risks for hepatitis C.   Skin self-exam. / Monthly.   Influenza immunization.** / Every year.   Pneumococcal polysaccharide immunization.** / 1 to 2 doses if you smoke cigarettes or if you have certain chronic medical conditions.   Tetanus, diphtheria, pertussis (Tdap, Td) immunization.** / A one-time dose of Tdap vaccine. After that, you need a Td booster dose every 10 years.   Measles, mumps, rubella (MMR) immunization. / You need at least 1 dose of MMR if you were born in 1957 or later. You may also need a second dose.   Varicella immunization.** / Consult your caregiver.   Meningococcal immunization.** / Consult your caregiver.   Hepatitis A immunization.** / Consult your caregiver. 2 doses, 6 to 18 months apart.   Hepatitis B immunization.** / Consult your caregiver. 3 doses, usually over 6 months.  Ages 65 and over  Blood pressure check.** / Every 1 to 2 years.   Lipid and cholesterol check.** / Every 5 years beginning at age 20.   Clinical breast exam.** / Every year after age 40.   Mammogram.** / Every year beginning at age 40 and continuing for as long as you are in good health. Consult with your caregiver.   Pap test.** /  Every 3 years starting at age 30 through age 65 or 70 with a 3 consecutive normal Pap tests. Testing can be stopped between 65 and 70 with 3 consecutive normal Pap tests and no abnormal Pap or HPV tests in the past 10 years.   HPV screening.** / Every 3 years from ages 30 through ages 65 or 70 with a history of 3 consecutive normal Pap tests. Testing can be stopped between 65 and 70 with 3 consecutive normal Pap tests and no abnormal Pap or HPV tests in the past 10 years.   Fecal occult blood test (FOBT) of stool. / Every year beginning at age 50 and continuing until age 75. You may not need to do this test if you get a colonoscopy every 10 years.   Flexible sigmoidoscopy or colonoscopy.** / Every 5 years for a flexible sigmoidoscopy or every 10 years for a colonoscopy beginning at age 50 and continuing until age 75.   Hepatitis   C blood test.** / For all people born from 1945 through 1965 and any individual with known risks for hepatitis C.   Osteoporosis screening.** / A one-time screening for women ages 65 and over and women at risk for fractures or osteoporosis.   Skin self-exam. / Monthly.   Influenza immunization.** / Every year.   Pneumococcal polysaccharide immunization.** / 1 dose at age 65 (or older) if you have never been vaccinated.   Tetanus, diphtheria, pertussis (Tdap, Td) immunization. / A one-time dose of Tdap vaccine if you are over 65 and have contact with an infant, are a healthcare worker, or simply want to be protected from whooping cough. After that, you need a Td booster dose every 10 years.   Varicella immunization.** / Consult your caregiver.   Meningococcal immunization.** / Consult your caregiver.   Hepatitis A immunization.** / Consult your caregiver. 2 doses, 6 to 18 months apart.   Hepatitis B immunization.** / Check with your caregiver. 3 doses, usually over 6 months.  ** Family history and personal history of risk and conditions may change your caregiver's  recommendations. Document Released: 03/11/2001 Document Revised: 01/02/2011 Document Reviewed: 06/10/2010 [employer] Patient Information 2012 [employer]. 

## 2011-06-03 NOTE — Assessment & Plan Note (Signed)
F/u cardio already scheduled

## 2011-06-03 NOTE — Progress Notes (Signed)
  Subjective:     Laura [name] is a 51 y.o. female and is here for a comprehensive physical exam. The patient reports problems - arthritis.  History   Social History  . Marital Status: Married    Spouse Name: N/A    Number of Children: N/A  . Years of Education: N/A   Occupational History  . Ship broker    Social History Main Topics  . Smoking status: Never Smoker   . Smokeless tobacco: Never Used  . Alcohol Use: 0.0 oz/week    2-3 drink(s) per week  . Drug Use: No  . Sexually Active: Yes -- Female partner(s)   Other Topics Concern  . Not on file   Social History Narrative   Exercise--  zumba and gym   Health Maintenance  Topic Date Due  . Mammogram  07/01/2011  . Influenza Vaccine  10/28/2011  . Pap Smear  10/03/2013  . Tetanus/tdap  11/10/2019    The following portions of the patient's history were reviewed and updated as appropriate: allergies, current medications, past family history, past medical history, past social history, past surgical history and problem list.  Review of Systems Review of Systems  Constitutional: Negative for activity change, appetite change and fatigue.  HENT: Negative for hearing loss, congestion, tinnitus and ear discharge.  dentist q40m Eyes: Negative for visual disturbance (see optho q1y -- vision corrected to 20/20 with glasses).  Respiratory: Negative for cough, chest tightness and shortness of breath.   Cardiovascular: Negative for chest pain, palpitations and leg swelling.  Gastrointestinal: Negative for abdominal pain, diarrhea, constipation and abdominal distention.  Genitourinary: Negative for urgency, frequency, decreased urine volume and difficulty urinating.  Musculoskeletal: Negative for back pain, arthralgias and gait problem.  Skin: Negative for color change, pallor and rash.  Neurological: Negative for dizziness, light-headedness, numbness and headaches.  Hematological: Negative for adenopathy. Does not bruise/bleed easily.    Psychiatric/Behavioral: Negative for suicidal ideas, confusion, sleep disturbance, self-injury, dysphoric mood, decreased concentration and agitation.    Derm---moles and eczema   Objective:    BP 108/70  Pulse 62  Temp(Src) 98.6 F (37 C) (Oral)  Ht 5' 8.5" (1.74 m)  Wt 155 lb (70.308 kg)  BMI 23.23 kg/m2  SpO2 97% General appearance: alert, cooperative, appears stated age and no distress Head: Normocephalic, without obvious abnormality, atraumatic Eyes: conjunctivae/corneas clear. PERRL, EOM's intact. Fundi benign. Ears: normal TM's and external ear canals both ears Nose: Nares normal. Septum midline. Mucosa normal. No drainage or sinus tenderness. Throat: lips, mucosa, and tongue normal; teeth and gums normal Neck: no adenopathy, no carotid bruit, no JVD, supple, symmetrical, trachea midline and thyroid not enlarged, symmetric, no tenderness/mass/nodules Back: symmetric, no curvature. ROM normal. No CVA tenderness. Lungs: clear to auscultation bilaterally Breasts: gyn Heart: S1, S2 normal and murmur 2/6 Abdomen: soft, non-tender; bowel sounds normal; no masses,  no organomegaly Pelvic: gyn Extremities: extremities normal, atraumatic, no cyanosis or edema Pulses: 2+ and symmetric Skin: Skin color, texture, turgor normal. No rashes or lesions Lymph nodes: Cervical, supraclavicular, and axillary nodes normal. Neurologic: Alert and oriented X 3, normal strength and tone. Normal symmetric reflexes. Normal coordination and gait psych--  no depression or anxiety    Assessment:    Healthy female exam.      Plan:  ghm utd Check labs   See After Visit Summary for Counseling Recommendations

## 2011-06-04 ENCOUNTER — Other Ambulatory Visit (INDEPENDENT_AMBULATORY_CARE_PROVIDER_SITE_OTHER): Payer: 59

## 2011-06-04 DIAGNOSIS — N390 Urinary tract infection, site not specified: Secondary | ICD-10-CM

## 2011-06-04 DIAGNOSIS — Z0000 Encounter for general adult medical examination without abnormal findings: Secondary | ICD-10-CM

## 2011-06-04 LAB — CBC WITH DIFFERENTIAL/PLATELET
Eosinophils Absolute: 0.1 10*3/uL (ref 0.0–0.7)
Eosinophils Relative: 1.3 % (ref 0.0–5.0)
HCT: 40.1 % (ref 36.0–46.0)
Hemoglobin: 13.5 g/dL (ref 12.0–15.0)
Lymphocytes Relative: 25.4 % (ref 12.0–46.0)
MCHC: 33.7 g/dL (ref 30.0–36.0)
Platelets: 226 10*3/uL (ref 150.0–400.0)
RBC: 4.4 Mil/uL (ref 3.87–5.11)
RDW: 13.7 % (ref 11.5–14.6)
WBC: 5.9 10*3/uL (ref 4.5–10.5)

## 2011-06-04 LAB — POCT URINALYSIS DIPSTICK
Bilirubin, UA: NEGATIVE
Nitrite, UA: NEGATIVE
Protein, UA: NEGATIVE
Spec Grav, UA: 1.015
pH, UA: 5

## 2011-06-04 LAB — LIPID PANEL
Cholesterol: 187 mg/dL (ref 0–200)
HDL: 49.9 mg/dL (ref >39.00)
LDL Cholesterol: 107 mg/dL — ABNORMAL HIGH (ref 0–99)
Triglycerides: 152 mg/dL — ABNORMAL HIGH (ref 0.0–149.0)
VLDL: 30.4 mg/dL (ref 0.0–40.0)

## 2011-06-04 LAB — HEPATIC FUNCTION PANEL
Bilirubin, Direct: 0 mg/dL (ref 0.0–0.3)
Total Bilirubin: 0.4 mg/dL (ref 0.3–1.2)

## 2011-06-04 LAB — BASIC METABOLIC PANEL
BUN: 12 mg/dL (ref 6–23)
Creatinine, Ser: 0.9 mg/dL (ref 0.4–1.2)
Sodium: 140 mEq/L (ref 135–145)

## 2011-06-04 LAB — TSH: TSH: 1.05 u[IU]/mL (ref 0.35–5.50)

## 2011-06-04 NOTE — Progress Notes (Signed)
Addended byDuaine Dredge, [place] Vierling L on: 06/04/2011 11:13 AM   Modules accepted: Orders

## 2011-06-04 NOTE — Progress Notes (Signed)
LABS ONLY  

## 2011-06-06 DIAGNOSIS — R319 Hematuria, unspecified: Secondary | ICD-10-CM

## 2011-06-06 LAB — URINE CULTURE: Colony Count: 25000

## 2011-06-20 ENCOUNTER — Other Ambulatory Visit (INDEPENDENT_AMBULATORY_CARE_PROVIDER_SITE_OTHER): Payer: 59

## 2011-06-20 DIAGNOSIS — R319 Hematuria, unspecified: Secondary | ICD-10-CM

## 2011-06-20 LAB — URINALYSIS
Hgb urine dipstick: NEGATIVE
Nitrite: NEGATIVE
Specific Gravity, Urine: >=1.03 (ref 1.000–1.030)
Total Protein, Urine: NEGATIVE
Urobilinogen, UA: 0.2 (ref 0.0–1.0)

## 2011-08-08 ENCOUNTER — Encounter: Payer: Self-pay | Admitting: Family Medicine

## 2011-08-08 ENCOUNTER — Ambulatory Visit (INDEPENDENT_AMBULATORY_CARE_PROVIDER_SITE_OTHER): Payer: 59 | Admitting: Family Medicine

## 2011-08-08 VITALS — BP 108/62 | HR 64 | Temp 98.5°F | Wt 152.4 lb

## 2011-08-08 DIAGNOSIS — M79609 Pain in unspecified limb: Secondary | ICD-10-CM

## 2011-08-08 DIAGNOSIS — M25519 Pain in unspecified shoulder: Secondary | ICD-10-CM

## 2011-08-08 DIAGNOSIS — M25512 Pain in left shoulder: Secondary | ICD-10-CM

## 2011-08-08 DIAGNOSIS — R252 Cramp and spasm: Secondary | ICD-10-CM

## 2011-08-08 DIAGNOSIS — M79669 Pain in unspecified lower leg: Secondary | ICD-10-CM

## 2011-08-08 DIAGNOSIS — R490 Dysphonia: Secondary | ICD-10-CM

## 2011-08-08 MED ORDER — CYCLOBENZAPRINE HCL 10 MG PO TABS: 10.0000 mg | ORAL_TABLET | Freq: Three times a day (TID) | ORAL | Status: AC | PRN

## 2011-08-08 NOTE — Progress Notes (Signed)
  Subjective:    Patient ID: Laura [name], female    DOB: 04/02/1960, 51 y.o.   MRN: [id number]  HPI Pt is here to ask for referrals --- she has had a hoarse voice for several days and has seen ENT in the past and had nodules removed.   She also c/o leg cramps and has tried to eat bananas and drink OJ daily.  She also c/o L shoulder pain---no recent injury.  She has been to PT in the past but it just seems to not improving.     Review of Systems As above    Objective:   Physical Exam  Constitutional: She is oriented to person, place, and time. She appears well-developed and well-nourished.  Neck: Normal range of motion. Neck supple.       + hoarse voice  Cardiovascular: Normal rate, regular rhythm and normal heart sounds.   Pulmonary/Chest: Effort normal and breath sounds normal. No stridor. No respiratory distress. She has no wheezes. She has no rales. She exhibits no tenderness.  Musculoskeletal: Normal range of motion. She exhibits no edema and no tenderness.       Calf pain  Lymphadenopathy:    She has no cervical adenopathy.  Neurological: She is alert and oriented to person, place, and time.  Psychiatric: She has a normal mood and affect. Her behavior is normal. Judgment and thought content normal.          Assessment & Plan:

## 2011-08-08 NOTE — Patient Instructions (Signed)
Hoarseness Hoarseness is produced from a variety of causes. It is important to find the cause so it can be treated. In the absence of a cold or upper respiratory illness, any hoarseness lasting more than 2 weeks should be looked at by a specialist. This is especially important if you have a history of smoking or alcohol use. It is also important to keep in mind that as you grow older, your voice will naturally get weaker, making it easier for you to become hoarse from straining your vocal cords.  CAUSES  Any illness that affects your vocal cords can result in a hoarse voice. Examples of conditions that can affect the vocal cords are listed as follows:   Allergies.   Colds.   Sinusitis.   Gastroesophageal reflux disease.   Croup.   Injury.   Nodules.   Exposure to smoke or toxic fumes or gases.   Congenital and genetic defects.   Paralysis of the vocal cords.   Infections.   Advanced age.  DIAGNOSIS  In order to diagnose the cause of your hoarseness, your caregiver will examine your throat using an instrument that uses a tube with a small lighted camera (laryngoscope). It allows your caregiver to look into the mouth and down the throat. TREATMENT  For most cases, treatment will focus on the specific cause of the hoarseness. Depending on the cause, hoarseness can be a temporary condition (acute) or it can be long lasting (chronic). Most cases of hoarseness clear up without complications. Your caregiver will explain to you if this is not likely to happen. SEEK IMMEDIATE MEDICAL CARE IF:   You have increasing hoarseness or loss of voice.   You have shortness of breath.   You are coughing up blood.   There is pain in your neck or throat.  Document Released: 12/27/2004 Document Revised: 01/02/2011 Document Reviewed: 03/21/2010 [facility] Patient Information 2012 [place], [place].

## 2011-08-10 DIAGNOSIS — M25519 Pain in unspecified shoulder: Secondary | ICD-10-CM | POA: Insufficient documentation

## 2011-08-10 DIAGNOSIS — R252 Cramp and spasm: Secondary | ICD-10-CM | POA: Insufficient documentation

## 2011-08-10 DIAGNOSIS — R490 Dysphonia: Secondary | ICD-10-CM | POA: Insufficient documentation

## 2011-08-10 NOTE — Assessment & Plan Note (Signed)
Pt done PT with little help and it has progressively gotten worse Refer to ortho

## 2011-08-10 NOTE — Assessment & Plan Note (Signed)
Check labs 

## 2011-08-10 NOTE — Assessment & Plan Note (Signed)
Hx nodules Refer to ENT Pt has been taking antihistamines

## 2011-08-19 ENCOUNTER — Encounter (HOSPITAL_BASED_OUTPATIENT_CLINIC_OR_DEPARTMENT_OTHER): Payer: Self-pay | Admitting: *Deleted

## 2011-08-19 NOTE — Progress Notes (Signed)
No labs needed Dr Ezzard Standing told her she could have a steroid injection to help with post op neck pain due to positioning during surgery
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
## 2011-08-25 ENCOUNTER — Encounter (HOSPITAL_BASED_OUTPATIENT_CLINIC_OR_DEPARTMENT_OTHER)
Admission: RE | Disposition: A | Discharge status: Home / Self Care | Payer: Self-pay | Source: Ambulatory Visit | Attending: Otolaryngology

## 2011-08-25 ENCOUNTER — Encounter (HOSPITAL_BASED_OUTPATIENT_CLINIC_OR_DEPARTMENT_OTHER): Payer: Self-pay

## 2011-08-25 ENCOUNTER — Ambulatory Visit (HOSPITAL_BASED_OUTPATIENT_CLINIC_OR_DEPARTMENT_OTHER): Payer: 59 | Admitting: Anesthesiology

## 2011-08-25 ENCOUNTER — Encounter (HOSPITAL_BASED_OUTPATIENT_CLINIC_OR_DEPARTMENT_OTHER): Payer: Self-pay | Admitting: Anesthesiology

## 2011-08-25 ENCOUNTER — Ambulatory Visit (HOSPITAL_BASED_OUTPATIENT_CLINIC_OR_DEPARTMENT_OTHER)
Admission: RE | Admit: 2011-08-25 | Discharge: 2011-08-25 | Disposition: A | Discharge status: Home / Self Care | Payer: 59 | Source: Ambulatory Visit | Attending: Otolaryngology | Admitting: Otolaryngology

## 2011-08-25 DIAGNOSIS — J383 Other diseases of vocal cords: Secondary | ICD-10-CM | POA: Insufficient documentation

## 2011-08-25 DIAGNOSIS — I059 Rheumatic mitral valve disease, unspecified: Secondary | ICD-10-CM | POA: Insufficient documentation

## 2011-08-25 DIAGNOSIS — R490 Dysphonia: Secondary | ICD-10-CM | POA: Insufficient documentation

## 2011-08-25 HISTORY — PX: MICROLARYNGOSCOPY: SHX5208

## 2011-08-25 LAB — POCT HEMOGLOBIN-HEMACUE: Hemoglobin: 12.9 g/dL (ref 12.0–15.0)

## 2011-08-25 SURGERY — MICROLARYNGOSCOPY
Anesthesia: General | Site: Throat | Wound class: Clean Contaminated

## 2011-08-25 MED ORDER — PROPOFOL 10 MG/ML IV EMUL
INTRAVENOUS | Status: DC | PRN
  Administered 2011-08-25: 100 ug/kg/min via INTRAVENOUS

## 2011-08-25 MED ORDER — MIDAZOLAM HCL 5 MG/5ML IJ SOLN
INTRAMUSCULAR | Status: DC | PRN
  Administered 2011-08-25: 2 mg via INTRAVENOUS

## 2011-08-25 MED ORDER — ESOMEPRAZOLE MAGNESIUM 40 MG PO PACK: 40.0000 mg | PACK | Freq: Every day | ORAL | Status: DC

## 2011-08-25 MED ORDER — FENTANYL CITRATE 0.05 MG/ML IJ SOLN
INTRAMUSCULAR | Status: DC | PRN
  Administered 2011-08-25: 50 ug via INTRAVENOUS

## 2011-08-25 MED ORDER — ONDANSETRON HCL 4 MG/2ML IJ SOLN: 4.0000 mg | Freq: Once | INTRAMUSCULAR | Status: DC | PRN

## 2011-08-25 MED ORDER — PROPOFOL 10 MG/ML IV EMUL
INTRAVENOUS | Status: DC | PRN
  Administered 2011-08-25: 200 mg via INTRAVENOUS

## 2011-08-25 MED ORDER — SUCCINYLCHOLINE CHLORIDE 20 MG/ML IJ SOLN
INTRAMUSCULAR | Status: DC | PRN
  Administered 2011-08-25: 80 mg via INTRAVENOUS

## 2011-08-25 MED ORDER — ONDANSETRON HCL 4 MG/2ML IJ SOLN
INTRAMUSCULAR | Status: DC | PRN
  Administered 2011-08-25: 4 mg via INTRAVENOUS

## 2011-08-25 MED ORDER — LIDOCAINE HCL (CARDIAC) 20 MG/ML IV SOLN
INTRAVENOUS | Status: DC | PRN
  Administered 2011-08-25: 50 mg via INTRAVENOUS

## 2011-08-25 MED ORDER — CEFAZOLIN SODIUM 1-5 GM-% IV SOLN
INTRAVENOUS | Status: DC | PRN
  Administered 2011-08-25: 1 g via INTRAVENOUS

## 2011-08-25 MED ORDER — EPINEPHRINE HCL 1 MG/ML IJ SOLN
INTRAMUSCULAR | Status: DC | PRN
  Administered 2011-08-25: .5 mg

## 2011-08-25 MED ORDER — HYDROMORPHONE HCL PF 1 MG/ML IJ SOLN
0.2500 mg | INTRAMUSCULAR | Status: DC | PRN
  Administered 2011-08-25 (×3): 0.5 mg via INTRAVENOUS

## 2011-08-25 MED ORDER — DEXAMETHASONE SODIUM PHOSPHATE 4 MG/ML IJ SOLN
INTRAMUSCULAR | Status: DC | PRN
  Administered 2011-08-25: 10 mg via INTRAVENOUS

## 2011-08-25 MED ORDER — LACTATED RINGERS IV SOLN
INTRAVENOUS | Status: DC
  Administered 2011-08-25 (×2): via INTRAVENOUS

## 2011-08-25 SURGICAL SUPPLY — 25 items
CANISTER SUCTION 1200CC (MISCELLANEOUS) ×2 IMPLANT
CLOTH BEACON ORANGE TIMEOUT ST (SAFETY) ×2 IMPLANT
CONT SPEC 4OZ CLIKSEAL STRL BL (MISCELLANEOUS) IMPLANT
GAUZE SPONGE 4X4 12PLY STRL LF (GAUZE/BANDAGES/DRESSINGS) ×4 IMPLANT
GLOVE SS BIOGEL STRL SZ 7.5 (GLOVE) ×1 IMPLANT
GLOVE SUPERSENSE BIOGEL SZ 7.5 (GLOVE) ×1
GOWN PREVENTION PLUS XLARGE (GOWN DISPOSABLE) IMPLANT
GOWN PREVENTION PLUS XXLARGE (GOWN DISPOSABLE) IMPLANT
MARKER SKIN DUAL TIP RULER LAB (MISCELLANEOUS) IMPLANT
NDL SAFETY ECLIPSE 18X1.5 (NEEDLE) ×1 IMPLANT
NDL SPNL 22GX7 QUINCKE BK (NEEDLE) IMPLANT
NEEDLE HYPO 18GX1.5 SHARP (NEEDLE) ×2
NEEDLE SPNL 22GX7 QUINCKE BK (NEEDLE) IMPLANT
NS IRRIG 1000ML POUR BTL (IV SOLUTION) ×2 IMPLANT
PATTIES SURGICAL .5 X3 (DISPOSABLE) ×2 IMPLANT
SHEET MEDIUM DRAPE 40X70 STRL (DRAPES) ×2 IMPLANT
SLEEVE SCD COMPRESS KNEE MED (MISCELLANEOUS) ×1 IMPLANT
SOLUTION ANTI FOG 6CC (MISCELLANEOUS) IMPLANT
SOLUTION BUTLER CLEAR DIP (MISCELLANEOUS) ×2 IMPLANT
SURGILUBE 2OZ TUBE FLIPTOP (MISCELLANEOUS) IMPLANT
SYR 5ML LL (SYRINGE) ×2 IMPLANT
SYR CONTROL 10ML LL (SYRINGE) IMPLANT
TOWEL OR 17X24 6PK STRL BLUE (TOWEL DISPOSABLE) ×2 IMPLANT
TUBE CONNECTING 20X1/4 (TUBING) ×2 IMPLANT
WATER STERILE IRR 1000ML POUR (IV SOLUTION) ×2 IMPLANT

## 2011-08-25 NOTE — Anesthesia Procedure Notes (Signed)
Procedure Name: Intubation Date/Time: 08/25/2011 7:51 AM Performed by: Burna Cash Pre-anesthesia Checklist: Patient identified, Emergency Drugs available, Suction available and Patient being monitored Patient Re-evaluated:Patient Re-evaluated prior to inductionOxygen Delivery Method: Circle System Utilized Preoxygenation: Pre-oxygenation with 100% oxygen Intubation Type: IV induction Ventilation: Mask ventilation without difficulty Laryngoscope Size: Mac and 3 Grade View: Grade I Tube type: Oral Tube size: 6.0 mm Number of attempts: 1 Placement Confirmation: ETT inserted through vocal cords under direct vision,  positive ETCO2 and breath sounds checked- equal and bilateral Secured at: 21 cm Tube secured with: Tape Dental Injury: Teeth and Oropharynx as per pre-operative assessment

## 2011-08-25 NOTE — Transfer of Care (Signed)
Immediate Anesthesia Transfer of Care Note  Patient: Laura [name]  Procedure(s) Performed: Procedure(s) (LRB): MICROLARYNGOSCOPY (N/A)  Patient Location: PACU  Anesthesia Type: General  Level of Consciousness: awake, alert  and oriented  Airway & Oxygen Therapy: Patient Spontanous Breathing and Patient connected to face mask oxygen  Post-op Assessment: Report given to PACU RN and Post -op Vital signs reviewed and stable  Post vital signs: Reviewed and stable  Complications: No apparent anesthesia complications

## 2011-08-25 NOTE — Interval H&P Note (Signed)
History and Physical Interval Note:  08/25/2011 7:33 AM  Laura [name]  has presented today for surgery, with the diagnosis of LARYNGITIS  The various methods of treatment have been discussed with the patient and family. After consideration of risks, benefits and other options for treatment, the patient has consented to  Procedure(s) (LRB): MICROLARYNGOSCOPY (N/A) as a surgical intervention .  The patient's history has been reviewed, patient examined, no change in status, stable for surgery.  I have reviewed the patient's chart and labs.  Questions were answered to the patient's satisfaction.     Emmons Toth

## 2011-08-25 NOTE — Brief Op Note (Signed)
08/25/2011  8:27 AM  PATIENT:  Laura [name]  51 y.o. female  PRE-OPERATIVE DIAGNOSIS:  HOARSENESS with R VOCAL CORD NODULE  POST-OPERATIVE DIAGNOSIS:  same as preop  PROCEDURE:  Procedure(s) (LRB): MICROLARYNGOSCOPY (N/A) with excisional biopsy of R VC nodule  SURGEON:  Surgeon(s) and Role:    * Drema Halon, MD - Primary  PHYSICIAN ASSISTANT:   ASSISTANTS: none   ANESTHESIA:   general  EBL:  Total I/O In: 1300 [I.V.:1300] Out: -   BLOOD ADMINISTERED:none  DRAINS: none   LOCAL MEDICATIONS USED:  NONE  SPECIMEN:  Source of Specimen:  right vocal cord nodule  DISPOSITION OF SPECIMEN:  PATHOLOGY  COUNTS:  YES  TOURNIQUET:  * No tourniquets in log *  DICTATION: .Other Dictation: Dictation Number [id number]  PLAN OF CARE: Discharge to home after PACU  PATIENT DISPOSITION:  PACU - hemodynamically stable.   Delay start of Pharmacological VTE agent (>24hrs) due to surgical blood loss or risk of bleeding: not applicable

## 2011-08-25 NOTE — Anesthesia Preprocedure Evaluation (Signed)
Anesthesia Evaluation  Patient identified by MRN, date of birth, ID band Patient awake    Airway Mallampati: I TM Distance: >3 FB     Dental  (+) Teeth Intact and Dental Advisory Given   Pulmonary  breath sounds clear to auscultation        Cardiovascular + Valvular Problems/Murmurs MVP Rhythm:Regular Rate:Normal     Neuro/Psych    GI/Hepatic   Endo/Other    Renal/GU      Musculoskeletal   Abdominal   Peds  Hematology   Anesthesia Other Findings   Reproductive/Obstetrics                           Anesthesia Physical Anesthesia Plan  ASA: II  Anesthesia Plan: General   Post-op Pain Management:    Induction: Intravenous  Airway Management Planned: Oral ETT  Additional Equipment:   Intra-op Plan:   Post-operative Plan: Extubation in OR  Informed Consent: I have reviewed the patients History and Physical, chart, labs and discussed the procedure including the risks, benefits and alternatives for the proposed anesthesia with the patient or authorized representative who has indicated his/her understanding and acceptance.   Dental advisory given  Plan Discussed with: CRNA, Anesthesiologist and Surgeon  Anesthesia Plan Comments:         Anesthesia Quick Evaluation

## 2011-08-25 NOTE — Op Note (Signed)
NAMEMarland Kitchen  Laura, [name] NO.:  [id number]  MEDICAL RECORD NO.:  [id number]  LOCATION:                                 FACILITY:  PHYSICIAN:  Kristine Garbe. Ezzard Standing, M.D. DATE OF BIRTH:  DATE OF PROCEDURE:  08/25/2011 DATE OF DISCHARGE:                              OPERATIVE REPORT   PREOPERATIVE DIAGNOSIS:  Right vocal cord nodule with hoarseness.  POSTOPERATIVE DIAGNOSIS:  Right vocal cord nodule with hoarseness.  OPERATION PERFORMED:  Microlaryngoscopy with excisional biopsy of right vocal cord nodule.  SURGEON:  Kristine Garbe. Ezzard Standing, M.D.  ANESTHESIA:  General endotracheal.  COMPLICATIONS:  None.  BRIEF CLINICAL INDICATION:  Laura [name] is a 51 year old female who has had hoarseness now for little over 6 weeks, the last couple weeks it has gotten a little bit worse.  On exam in the office with fiberoptic laryngoscopy, she has a small nodule on the right vocal cord in the mid region of the right vocal cord.  She is taken to the operating room at this time for microlaryngoscopy and excision of right vocal cord nodule.  DESCRIPTION OF PROCEDURE:  After adequate endotracheal anesthesia, direct laryngoscopy was performed.  The smaller laryngoscope was utilized to evaluate the vocal cords.  The laryngoscope was suspended. She was noted to have a round smooth nodule of the right mid vocal cord, a small reactive nodule on the left vocal cord.  Using upbiting forceps and small micro scissors, the nodule was excised.  Specimen sent to Pathology.  Hemostasis was obtained with cotton pledgets soaked in adrenaline.  Photos were obtained prior to and post procedure of the vocal cords.  Remaining direct laryngoscopy, epiglottis was normal in appearance.  False and true cords were clear otherwise.  Piriform sinuses were clear.  Base of tongue was normal.  This completed the procedure.  Laura [name] was awoken from anesthesia, and transferred to the recovery room postop doing  well.  DISPOSITION:  She is discharged home later this morning on Nexium for the next 2 weeks.  Instructed on voice rest.  We will have her follow up in my office in 10 days to 2 weeks for recheck.          ______________________________ Kristine Garbe. Ezzard Standing, M.D.    CEN/MEDQ  D:  08/25/2011  T:  08/25/2011  Job:  [id number]

## 2011-08-25 NOTE — Anesthesia Postprocedure Evaluation (Signed)
  Anesthesia Post-op Note  Patient: Laura [name]  Procedure(s) Performed: Procedure(s) (LRB): MICROLARYNGOSCOPY (N/A)  Patient Location: PACU  Anesthesia Type: General  Level of Consciousness: awake, alert  and oriented  Airway and Oxygen Therapy: Patient Spontanous Breathing and Patient connected to face mask oxygen  Post-op Pain: mild  Post-op Assessment: Post-op Vital signs reviewed  Post-op Vital Signs: Reviewed  Complications: No apparent anesthesia complications

## 2011-08-26 ENCOUNTER — Encounter (HOSPITAL_BASED_OUTPATIENT_CLINIC_OR_DEPARTMENT_OTHER): Payer: Self-pay | Admitting: Otolaryngology

## 2011-08-28 DIAGNOSIS — J381 Polyp of vocal cord and larynx: Secondary | ICD-10-CM

## 2011-08-28 HISTORY — DX: Polyp of vocal cord and larynx: J38.1

## 2011-09-25 ENCOUNTER — Encounter (HOSPITAL_BASED_OUTPATIENT_CLINIC_OR_DEPARTMENT_OTHER): Payer: Self-pay | Admitting: *Deleted

## 2011-09-25 DIAGNOSIS — H01133 Eczematous dermatitis of right eye, unspecified eyelid: Secondary | ICD-10-CM

## 2011-09-25 DIAGNOSIS — R0981 Nasal congestion: Secondary | ICD-10-CM

## 2011-09-25 HISTORY — DX: Nasal congestion: R09.81

## 2011-09-25 HISTORY — DX: Eczematous dermatitis of right eye, unspecified eyelid: H01.133
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
## 2011-09-30 ENCOUNTER — Encounter (HOSPITAL_BASED_OUTPATIENT_CLINIC_OR_DEPARTMENT_OTHER): Payer: Self-pay | Admitting: Anesthesiology

## 2011-09-30 ENCOUNTER — Encounter (HOSPITAL_BASED_OUTPATIENT_CLINIC_OR_DEPARTMENT_OTHER): Payer: Self-pay | Admitting: *Deleted

## 2011-09-30 ENCOUNTER — Ambulatory Visit (HOSPITAL_BASED_OUTPATIENT_CLINIC_OR_DEPARTMENT_OTHER): Payer: 59 | Admitting: Anesthesiology

## 2011-09-30 ENCOUNTER — Ambulatory Visit (HOSPITAL_BASED_OUTPATIENT_CLINIC_OR_DEPARTMENT_OTHER)
Admission: RE | Admit: 2011-09-30 | Discharge: 2011-09-30 | Disposition: A | Discharge status: Home / Self Care | Payer: 59 | Source: Ambulatory Visit | Attending: Otolaryngology | Admitting: Otolaryngology

## 2011-09-30 ENCOUNTER — Encounter (HOSPITAL_BASED_OUTPATIENT_CLINIC_OR_DEPARTMENT_OTHER)
Admission: RE | Disposition: A | Discharge status: Home / Self Care | Payer: Self-pay | Source: Ambulatory Visit | Attending: Otolaryngology

## 2011-09-30 DIAGNOSIS — I059 Rheumatic mitral valve disease, unspecified: Secondary | ICD-10-CM | POA: Insufficient documentation

## 2011-09-30 DIAGNOSIS — J381 Polyp of vocal cord and larynx: Secondary | ICD-10-CM | POA: Insufficient documentation

## 2011-09-30 DIAGNOSIS — M1990 Unspecified osteoarthritis, unspecified site: Secondary | ICD-10-CM | POA: Insufficient documentation

## 2011-09-30 HISTORY — DX: Other specified postprocedural states: Z98.890

## 2011-09-30 HISTORY — DX: Nasal congestion: R09.81

## 2011-09-30 HISTORY — DX: Dental restoration status: Z98.811

## 2011-09-30 HISTORY — DX: Polyp of vocal cord and larynx: J38.1

## 2011-09-30 HISTORY — DX: Eczematous dermatitis of right eye, unspecified eyelid: H01.133

## 2011-09-30 HISTORY — DX: Other specified postprocedural states: R11.2

## 2011-09-30 SURGERY — MICROLARYNGOSCOPY WITH CO2 LASER AND EXCISION OF VOCAL CORD LESION
Anesthesia: General | Site: Throat | Wound class: Clean Contaminated

## 2011-09-30 MED ORDER — MIDAZOLAM HCL 5 MG/5ML IJ SOLN
INTRAMUSCULAR | Status: DC | PRN
  Administered 2011-09-30: 2 mg via INTRAVENOUS

## 2011-09-30 MED ORDER — OXYCODONE HCL 5 MG PO TABS: 5.0000 mg | ORAL_TABLET | Freq: Once | ORAL | Status: DC | PRN

## 2011-09-30 MED ORDER — HYDROCODONE-ACETAMINOPHEN 5-500 MG PO TABS: 1.0000 | ORAL_TABLET | Freq: Four times a day (QID) | ORAL | Status: AC | PRN

## 2011-09-30 MED ORDER — METOCLOPRAMIDE HCL 5 MG/ML IJ SOLN
INTRAMUSCULAR | Status: DC | PRN
  Administered 2011-09-30: 10 mg via INTRAVENOUS

## 2011-09-30 MED ORDER — OXYCODONE HCL 5 MG/5ML PO SOLN: 5.0000 mg | Freq: Once | ORAL | Status: DC | PRN

## 2011-09-30 MED ORDER — ONDANSETRON HCL 4 MG/2ML IJ SOLN
INTRAMUSCULAR | Status: DC | PRN
  Administered 2011-09-30: 4 mg via INTRAVENOUS

## 2011-09-30 MED ORDER — LIDOCAINE HCL (CARDIAC) 10 MG/ML IV SOLN
INTRAVENOUS | Status: DC | PRN
  Administered 2011-09-30: 100 mg via INTRAVENOUS

## 2011-09-30 MED ORDER — LACTATED RINGERS IV SOLN
INTRAVENOUS | Status: DC
  Administered 2011-09-30 (×2): via INTRAVENOUS

## 2011-09-30 MED ORDER — SUCCINYLCHOLINE CHLORIDE 20 MG/ML IJ SOLN
INTRAMUSCULAR | Status: DC | PRN
  Administered 2011-09-30: 100 mg via INTRAVENOUS

## 2011-09-30 MED ORDER — METOCLOPRAMIDE HCL 5 MG/ML IJ SOLN: 10.0000 mg | Freq: Once | INTRAMUSCULAR | Status: DC | PRN

## 2011-09-30 MED ORDER — SCOPOLAMINE 1 MG/3DAYS TD PT72: 1.0000 | MEDICATED_PATCH | Freq: Once | TRANSDERMAL | Status: DC

## 2011-09-30 MED ORDER — ACETAMINOPHEN 10 MG/ML IV SOLN
1000.0000 mg | Freq: Once | INTRAVENOUS | Status: AC
  Administered 2011-09-30: 1000 mg via INTRAVENOUS

## 2011-09-30 MED ORDER — DEXAMETHASONE SODIUM PHOSPHATE 4 MG/ML IJ SOLN
INTRAMUSCULAR | Status: DC | PRN
  Administered 2011-09-30: 10 mg via INTRAVENOUS

## 2011-09-30 MED ORDER — FENTANYL CITRATE 0.05 MG/ML IJ SOLN
INTRAMUSCULAR | Status: DC | PRN
  Administered 2011-09-30 (×2): 50 ug via INTRAVENOUS

## 2011-09-30 MED ORDER — EPINEPHRINE HCL 1 MG/ML IJ SOLN
INTRAMUSCULAR | Status: DC | PRN
  Administered 2011-09-30: 1 mg

## 2011-09-30 MED ORDER — PROPOFOL 10 MG/ML IV BOLUS
INTRAVENOUS | Status: DC | PRN
  Administered 2011-09-30: 200 mg via INTRAVENOUS

## 2011-09-30 MED ORDER — FENTANYL CITRATE 0.05 MG/ML IJ SOLN
25.0000 ug | INTRAMUSCULAR | Status: DC | PRN
  Administered 2011-09-30: 25 ug via INTRAVENOUS

## 2011-09-30 SURGICAL SUPPLY — 25 items
4985 IMPLANT
CANISTER SUCTION 1200CC (MISCELLANEOUS) ×4 IMPLANT
CLOTH BEACON ORANGE TIMEOUT ST (SAFETY) ×2 IMPLANT
DEPRESSOR TONGUE BLADE STERILE (MISCELLANEOUS) ×1 IMPLANT
FILTER 7/8 IN (FILTER) ×1 IMPLANT
GAUZE SPONGE 4X4 12PLY STRL LF (GAUZE/BANDAGES/DRESSINGS) ×4 IMPLANT
GLOVE ECLIPSE 6.5 STRL STRAW (GLOVE) ×1 IMPLANT
GLOVE SS BIOGEL STRL SZ 7.5 (GLOVE) ×1 IMPLANT
GLOVE SUPERSENSE BIOGEL SZ 7.5 (GLOVE) ×1
GOWN PREVENTION PLUS XLARGE (GOWN DISPOSABLE) IMPLANT
GUARD TEETH (MISCELLANEOUS) ×1 IMPLANT
MARKER SKIN DUAL TIP RULER LAB (MISCELLANEOUS) IMPLANT
NDL SPNL 22GX7 QUINCKE BK (NEEDLE) IMPLANT
NEEDLE SPNL 22GX7 QUINCKE BK (NEEDLE) IMPLANT
NS IRRIG 1000ML POUR BTL (IV SOLUTION) ×2 IMPLANT
PAD EYE OVAL STERILE LF (GAUZE/BANDAGES/DRESSINGS) ×4 IMPLANT
PATTIES SURGICAL .5 X3 (DISPOSABLE) ×2 IMPLANT
REDUCTION FITTING 1/4 IN (FILTER) ×1 IMPLANT
SHEET MEDIUM DRAPE 40X70 STRL (DRAPES) ×2 IMPLANT
SLEEVE SCD COMPRESS KNEE MED (MISCELLANEOUS) ×2 IMPLANT
SOLUTION BUTLER CLEAR DIP (MISCELLANEOUS) ×2 IMPLANT
SYR CONTROL 10ML LL (SYRINGE) ×1 IMPLANT
TOWEL OR 17X24 6PK STRL BLUE (TOWEL DISPOSABLE) ×2 IMPLANT
TUBE CONNECTING 20X1/4 (TUBING) ×3 IMPLANT
WATER STERILE IRR 1000ML POUR (IV SOLUTION) IMPLANT

## 2011-09-30 NOTE — Interval H&P Note (Signed)
History and Physical Interval Note:  09/30/2011 9:20 AM  Laura [name]  has presented today for surgery, with the diagnosis of vocal cord polyp  The various methods of treatment have been discussed with the patient and family. After consideration of risks, benefits and other options for treatment, the patient has consented to  Procedure(s) (LRB): MICROLARYNGOSCOPY WITH CO2 LASER AND EXCISION OF VOCAL CORD LESION (N/A) as a surgical intervention .  The patient's history has been reviewed, patient examined, no change in status, stable for surgery.  I have reviewed the patient's chart and labs.  Questions were answered to the patient's satisfaction.     NEWMAN, CHRISTOPHER

## 2011-09-30 NOTE — Anesthesia Procedure Notes (Signed)
Procedure Name: Intubation Date/Time: 09/30/2011 9:39 AM Performed by: Gar Gibbon Pre-anesthesia Checklist: Patient identified, Emergency Drugs available, Suction available and Patient being monitored Patient Re-evaluated:Patient Re-evaluated prior to inductionOxygen Delivery Method: Circle System Utilized Preoxygenation: Pre-oxygenation with 100% oxygen Intubation Type: IV induction Ventilation: Mask ventilation without difficulty Laryngoscope Size: Miller and 3 Grade View: Grade III Tube type: Oral Tube size: 5.5 mm Number of attempts: 1 Airway Equipment and Method: stylet and oral airway Placement Confirmation: ETT inserted through vocal cords under direct vision,  positive ETCO2 and breath sounds checked- equal and bilateral Secured at: 21 cm Tube secured with: Tape Dental Injury: Teeth and Oropharynx as per pre-operative assessment

## 2011-09-30 NOTE — Transfer of Care (Signed)
Immediate Anesthesia Transfer of Care Note  Patient: Laura [name]  Procedure(s) Performed: Procedure(s) (LRB) with comments: MICROLARYNGOSCOPY WITH CO2 LASER AND EXCISION OF VOCAL CORD LESION (N/A) - microlaryngoscopy with excision  vocal cord lesion   Patient Location: PACU  Anesthesia Type: General  Level of Consciousness: awake and patient cooperative  Airway & Oxygen Therapy: Patient Spontanous Breathing and aerosol face mask  Post-op Assessment: Report given to PACU RN and Post -op Vital signs reviewed and stable  Post vital signs: Reviewed and stable  Complications: No apparent anesthesia complications

## 2011-09-30 NOTE — Brief Op Note (Signed)
09/30/2011  10:07 AM  PATIENT:  [name]  51 y.o. female  PRE-OPERATIVE DIAGNOSIS:  vocal cord polyp  POST-OPERATIVE DIAGNOSIS:  * No post-op diagnosis entered *  PROCEDURE:  Microlaryngoscopy with Excision of Right Vocal Cord Polyp  SURGEON:  Surgeon(s) and Role:    * Drema Halon, MD - Primary  PHYSICIAN ASSISTANT:   ASSISTANTS: none   ANESTHESIA:   general  EBL:  Total I/O In: 1000 [I.V.:1000] Out: -   BLOOD ADMINISTERED:none  DRAINS: none   LOCAL MEDICATIONS USED:  NONE  SPECIMEN:  Source of Specimen:  R vocal cord  DISPOSITION OF SPECIMEN:  PATHOLOGY  COUNTS:  YES  TOURNIQUET:  * No tourniquets in log *  DICTATION: .Other Dictation: Dictation Number [id number]  PLAN OF CARE: Discharge to home after PACU  PATIENT DISPOSITION:  PACU - hemodynamically stable.   Delay start of Pharmacological VTE agent (>24hrs) due to surgical blood loss or risk of bleeding: yes

## 2011-09-30 NOTE — Anesthesia Postprocedure Evaluation (Signed)
Anesthesia Post Note  Patient: Laura [name]  Procedure(s) Performed: Procedure(s) (LRB): MICROLARYNGOSCOPY WITH CO2 LASER AND EXCISION OF VOCAL CORD LESION (N/A)  Anesthesia type: General  Patient location: PACU  Post pain: Pain level controlled  Post assessment: Patient's Cardiovascular Status Stable  Last Vitals:  Filed Vitals:   09/30/11 1112  BP: 117/74  Pulse: 65  Temp: 36.5 C  Resp: 16    Post vital signs: Reviewed and stable  Level of consciousness: alert  Complications: No apparent anesthesia complications

## 2011-09-30 NOTE — Anesthesia Preprocedure Evaluation (Signed)
Anesthesia Evaluation  Patient identified by MRN, date of birth, ID band Patient awake    Reviewed: Allergy & Precautions, H&P , NPO status , Patient's Chart, lab work & pertinent test results, reviewed documented beta blocker date and time   History of Anesthesia Complications (+) PONV  Airway Mallampati: II TM Distance: >3 FB Neck ROM: full    Dental   Pulmonary neg pulmonary ROS,  breath sounds clear to auscultation        Cardiovascular + Valvular Problems/Murmurs MVP Rhythm:regular     Neuro/Psych  Neuromuscular disease negative neurological ROS  negative psych ROS   GI/Hepatic negative GI ROS, Neg liver ROS,   Endo/Other  negative endocrine ROS  Renal/GU negative Renal ROS  negative genitourinary   Musculoskeletal   Abdominal   Peds  Hematology negative hematology ROS (+)   Anesthesia Other Findings See surgeon's H&P   Reproductive/Obstetrics negative OB ROS                           Anesthesia Physical Anesthesia Plan  ASA: II  Anesthesia Plan: General   Post-op Pain Management:    Induction: Intravenous  Airway Management Planned: Oral ETT  Additional Equipment:   Intra-op Plan:   Post-operative Plan: Extubation in OR  Informed Consent: I have reviewed the patients History and Physical, chart, labs and discussed the procedure including the risks, benefits and alternatives for the proposed anesthesia with the patient or authorized representative who has indicated his/her understanding and acceptance.   Dental Advisory Given  Plan Discussed with: CRNA and Surgeon  Anesthesia Plan Comments:         Anesthesia Quick Evaluation

## 2011-10-01 LAB — POCT HEMOGLOBIN-HEMACUE: Hemoglobin: 14.5 g/dL (ref 12.0–15.0)

## 2011-10-01 NOTE — Op Note (Signed)
NAMEMarland Kitchen  ANWITHA, MAPES NO.:  [id number]  MEDICAL RECORD NO.:  [id number]  LOCATION:                                 FACILITY:  PHYSICIAN:  Kristine Garbe. Ezzard Standing, M.D. DATE OF BIRTH:  DATE OF PROCEDURE:  09/30/2011 DATE OF DISCHARGE:                              OPERATIVE REPORT   PREOPERATIVE DIAGNOSIS:  Hoarseness with right vocal cord polyp.  POSTOPERATIVE DIAGNOSIS:  Hoarseness with right vocal cord polyp.  OPERATION PERFORMED:  Microlaryngoscopy with excision of right vocal cord polyp.  SURGEON:  Kristine Garbe. Ezzard Standing, MD  ANESTHESIA:  General endotracheal.  COMPLICATIONS:  None.  BRIEF CLINICAL NOTE:  Laura [name] is a 51 year old female who has had some progressive hoarseness over the last 2-3 weeks.  On exam in the office on microlaryngoscopy, she had a polypoid lesion arising from the anterior right vocal cord.  She had a previous excision of a polyp from the right vocal cord about 2 months ago.  She was taken to the operating room at this time for microlaryngoscopy and excision of right vocal cord polyp and discussed the possible use of CO2 laser.  DESCRIPTION OF PROCEDURE:  After adequate endotracheal anesthesia, laryngoscope was used to visualize the vocal cords and suspended.  After suspending the laryngoscope, photos were obtained.  The patient had an erythematous nodule arising from the anterior one third of the right vocal cord.  After photos were obtained using cup forceps and scissors, the polyp was excised at its base underneath the surface of the right vocal cord.  After excising the polyp, cotton pledgets soaked and adrenaline was used for hemostasis.  Another photo was obtained.  Polyp was totally excised and this completed the procedure.  Laura was subsequently awakened from anesthesia and transferred to the recovery room, postop doing well.  DISPOSITION:  She will be discharged home later this morning on antacid therapy and  steroid for 10 days; however, follow up in my office in 2 weeks for recheck.          ______________________________ Kristine Garbe. Ezzard Standing, M.D.    CEN/MEDQ  D:  09/30/2011  T:  10/01/2011  Job:  [id number]

## 2011-10-16 ENCOUNTER — Other Ambulatory Visit: Payer: Self-pay | Admitting: Obstetrics & Gynecology

## 2011-10-16 DIAGNOSIS — Z1231 Encounter for screening mammogram for malignant neoplasm of breast: Secondary | ICD-10-CM

## 2011-10-21 ENCOUNTER — Ambulatory Visit
Admission: RE | Admit: 2011-10-21 | Discharge: 2011-10-21 | Disposition: A | Discharge status: Home / Self Care | Payer: 59 | Source: Ambulatory Visit | Attending: Obstetrics & Gynecology | Admitting: Obstetrics & Gynecology

## 2011-10-21 DIAGNOSIS — Z1231 Encounter for screening mammogram for malignant neoplasm of breast: Secondary | ICD-10-CM

## 2012-01-01 ENCOUNTER — Other Ambulatory Visit: Payer: Self-pay | Admitting: Dermatology

## 2012-02-23 ENCOUNTER — Telehealth: Payer: Self-pay | Admitting: Family Medicine

## 2012-02-23 MED ORDER — PENCICLOVIR 1 % EX CREA: TOPICAL_CREAM | CUTANEOUS | Status: DC

## 2012-02-23 MED ORDER — VALACYCLOVIR HCL 1 G PO TABS: 1000.0000 mg | ORAL_TABLET | Freq: Three times a day (TID) | ORAL | Status: DC

## 2012-02-23 NOTE — Telephone Encounter (Signed)
Valtrex 1000mg   1 po tid for 10 days,  denvir cream as directed

## 2012-02-23 NOTE — Telephone Encounter (Signed)
Patient is stating the pharmacy has to order the Denavir. Do we have any samples she can pick up while she is waiting?

## 2012-02-23 NOTE — Telephone Encounter (Signed)
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  Requesting Valtrex and Benazir Cream to apply topically to sore on lip.  She needs meds called to [facility] [address] [facility] [address].  Please call her after meds called in.  She has presentation at work tomorrow and is hoping to start meds today.

## 2012-02-23 NOTE — Telephone Encounter (Signed)
Patient aware we have no samples available.     KP

## 2012-02-23 NOTE — Telephone Encounter (Signed)
Rx sent to the pharmacy.     KP 

## 2012-03-13 ENCOUNTER — Other Ambulatory Visit: Payer: Self-pay

## 2012-08-09 ENCOUNTER — Encounter: Payer: Self-pay | Admitting: Family Medicine

## 2012-08-09 ENCOUNTER — Ambulatory Visit (INDEPENDENT_AMBULATORY_CARE_PROVIDER_SITE_OTHER): Payer: 59 | Admitting: Family Medicine

## 2012-08-09 VITALS — BP 110/68 | HR 93 | Temp 98.5°F | Wt 161.4 lb

## 2012-08-09 DIAGNOSIS — J329 Chronic sinusitis, unspecified: Secondary | ICD-10-CM

## 2012-08-09 DIAGNOSIS — M19049 Primary osteoarthritis, unspecified hand: Secondary | ICD-10-CM

## 2012-08-09 DIAGNOSIS — M1990 Unspecified osteoarthritis, unspecified site: Secondary | ICD-10-CM

## 2012-08-09 MED ORDER — AZELASTINE-FLUTICASONE 137-50 MCG/ACT NA SUSP: 1.0000 | Freq: Two times a day (BID) | NASAL | Status: DC

## 2012-08-09 MED ORDER — MELOXICAM 15 MG PO TABS: ORAL_TABLET | ORAL | Status: DC

## 2012-08-09 MED ORDER — CEFUROXIME AXETIL 500 MG PO TABS: 500.0000 mg | ORAL_TABLET | Freq: Two times a day (BID) | ORAL | Status: AC

## 2012-08-09 NOTE — Progress Notes (Signed)
  Subjective:     Laura [name] is a 52 y.o. female who presents for evaluation of sinus pain. Symptoms include: congestion, facial pain, headaches, nasal congestion, post nasal drip and sinus pressure. Onset of symptoms was 4 days ago. Symptoms have been gradually worsening since that time. Past history is significant for no history of pneumonia or bronchitis. Patient is a non-smoker.  The following portions of the patient's history were reviewed and updated as appropriate: allergies, current medications, past family history, past medical history, past social history, past surgical history and problem list.  Review of Systems Pertinent items are noted in HPI.   Objective:    BP 110/68  Pulse 93  Temp(Src) 98.5 F (36.9 C) (Oral)  Wt 161 lb 6.4 oz (73.211 kg)  BMI 24.18 kg/m2  SpO2 97% General appearance: alert, cooperative, appears stated age and no distress Ears: normal TM's and external ear canals both ears Nose: green discharge, moderate congestion, turbinates red, swollen, sinus tenderness bilateral Throat: lips, mucosa, and tongue normal; teeth and gums normal Neck: no adenopathy, supple, symmetrical, trachea midline and thyroid not enlarged, symmetric, no tenderness/mass/nodules Lungs: clear to auscultation bilaterally Heart: S1, S2 normal    Assessment:    Acute bacterial sinusitis.    Plan:    Nasal steroids per medication orders. Antihistamines per medication orders. Ceftin per medication orders.

## 2012-08-09 NOTE — Assessment & Plan Note (Signed)
Worsening pain in fingers mobic qd ---consider celebrex if no relief

## 2012-08-09 NOTE — Patient Instructions (Signed)

## 2012-11-08 ENCOUNTER — Ambulatory Visit (INDEPENDENT_AMBULATORY_CARE_PROVIDER_SITE_OTHER): Payer: 59 | Admitting: Family Medicine

## 2012-11-08 ENCOUNTER — Encounter: Payer: Self-pay | Admitting: Family Medicine

## 2012-11-08 VITALS — BP 120/70 | HR 73 | Temp 98.7°F | Wt 160.0 lb

## 2012-11-08 DIAGNOSIS — J209 Acute bronchitis, unspecified: Secondary | ICD-10-CM

## 2012-11-08 MED ORDER — ALBUTEROL SULFATE HFA 108 (90 BASE) MCG/ACT IN AERS: 2.0000 | INHALATION_SPRAY | Freq: Four times a day (QID) | RESPIRATORY_TRACT | Status: DC | PRN

## 2012-11-08 MED ORDER — BECLOMETHASONE DIPROPIONATE 40 MCG/ACT IN AERS: 2.0000 | INHALATION_SPRAY | Freq: Two times a day (BID) | RESPIRATORY_TRACT | Status: DC

## 2012-11-08 MED ORDER — AZITHROMYCIN 250 MG PO TABS: ORAL_TABLET | ORAL | Status: DC

## 2012-11-08 NOTE — Patient Instructions (Signed)

## 2012-11-08 NOTE — Progress Notes (Signed)
  Subjective:     Laura [name] is a 52 y.o. female who presents for evaluation of symptoms of a URI. Symptoms include cough described as productive, no  fever, shortness of breath and wheezing. Onset of symptoms was 5 days ago, and has been gradually worsening since that time. Treatment to date: steroids for rash.  The following portions of the patient's history were reviewed and updated as appropriate: allergies, current medications, past family history, past medical history, past social history, past surgical history and problem list.  Review of Systems Pertinent items are noted in HPI.   Objective:    BP 120/70  Pulse 73  Temp(Src) 98.7 F (37.1 C) (Oral)  Wt 160 lb (72.576 kg)  BMI 23.97 kg/m2  SpO2 98% General appearance: alert, cooperative, appears stated age and no distress Ears: normal TM's and external ear canals both ears Nose: clear discharge, mild congestion, no sinus tenderness Throat: lips, mucosa, and tongue normal; teeth and gums normal Neck: no adenopathy, supple, symmetrical, trachea midline and thyroid not enlarged, symmetric, no tenderness/mass/nodules Lungs: wheezes bilaterally Heart: S1, S2 normal   Assessment:    bronchitis   Plan:    Suggested symptomatic OTC remedies. Nasal saline spray for congestion. Zithromax per orders. Follow up as needed. proair and qvar

## 2012-12-02 ENCOUNTER — Other Ambulatory Visit: Payer: Self-pay

## 2012-12-29 ENCOUNTER — Ambulatory Visit
Admission: RE | Admit: 2012-12-29 | Discharge: 2012-12-29 | Disposition: A | Discharge status: Home / Self Care | Payer: Self-pay | Source: Ambulatory Visit

## 2012-12-29 ENCOUNTER — Other Ambulatory Visit: Payer: Self-pay

## 2012-12-29 DIAGNOSIS — Z1231 Encounter for screening mammogram for malignant neoplasm of breast: Secondary | ICD-10-CM

## 2013-05-12 ENCOUNTER — Other Ambulatory Visit: Payer: Self-pay | Admitting: Occupational Medicine

## 2013-05-12 ENCOUNTER — Ambulatory Visit: Payer: Self-pay

## 2013-05-12 DIAGNOSIS — Z0000 Encounter for general adult medical examination without abnormal findings: Secondary | ICD-10-CM

## 2013-06-27 ENCOUNTER — Ambulatory Visit (INDEPENDENT_AMBULATORY_CARE_PROVIDER_SITE_OTHER): Payer: BC Managed Care – PPO | Admitting: Family Medicine

## 2013-06-27 ENCOUNTER — Encounter: Payer: Self-pay | Admitting: Family Medicine

## 2013-06-27 VITALS — BP 108/72 | HR 63 | Temp 99.0°F | Wt 158.0 lb

## 2013-06-27 DIAGNOSIS — L237 Allergic contact dermatitis due to plants, except food: Secondary | ICD-10-CM

## 2013-06-27 DIAGNOSIS — L255 Unspecified contact dermatitis due to plants, except food: Secondary | ICD-10-CM

## 2013-06-27 MED ORDER — PREDNISONE 10 MG PO TABS: ORAL_TABLET | ORAL | Status: DC

## 2013-06-27 MED ORDER — METHYLPREDNISOLONE ACETATE 80 MG/ML IJ SUSP
80.0000 mg | Freq: Once | INTRAMUSCULAR | Status: AC
  Administered 2013-06-27: 80 mg via INTRAMUSCULAR

## 2013-06-27 NOTE — Addendum Note (Signed)
Addended by: Arnette Norris on: 06/27/2013 04:59 PM   Modules accepted: Orders

## 2013-06-27 NOTE — Patient Instructions (Signed)

## 2013-06-27 NOTE — Progress Notes (Signed)
  Subjective:     Laura [name] is a 53 y.o. female who presents for evaluation of a rash involving the forearm. Rash started several days ago. Lesions are pink, and blistering in texture. Rash has changed over time. Rash is pruritic. Associated symptoms: none. Patient denies: abdominal pain, arthralgia, congestion, cough, crankiness, decrease in appetite, decrease in energy level, fever, headache, irritability, myalgia, nausea, sore throat and vomiting. Patient has not had contacts with similar rash. Patient has had new exposures (soaps, lotions, laundry detergents, foods, medications, plants, insects or animals).  The following portions of the patient's history were reviewed and updated as appropriate: allergies, current medications, past family history, past medical history, past social history, past surgical history and problem list.  Review of Systems Pertinent items are noted in HPI.    Objective:    BP 108/72  Pulse 63  Temp(Src) 99 F (37.2 C) (Oral)  Wt 158 lb (71.668 kg)  SpO2 98% General:  alert, cooperative, appears stated age and no distress  Skin:  vesicles noted on extremities     Assessment:    contact dermatitis: plants poison ivy    Plan:    Medications: steroids: depo medrol and pred taper. Written patient instruction given. Follow up in a few days. --prn

## 2013-06-27 NOTE — Progress Notes (Signed)
Pre visit review using our clinic review tool, if applicable. No additional management support is needed unless otherwise documented below in the visit note. 

## 2013-11-17 ENCOUNTER — Other Ambulatory Visit: Payer: Self-pay | Admitting: Obstetrics & Gynecology

## 2013-11-17 DIAGNOSIS — N63 Unspecified lump in unspecified breast: Secondary | ICD-10-CM

## 2013-11-21 ENCOUNTER — Ambulatory Visit
Admission: RE | Admit: 2013-11-21 | Discharge: 2013-11-21 | Disposition: A | Discharge status: Home / Self Care | Payer: BC Managed Care – PPO | Source: Ambulatory Visit | Attending: Obstetrics & Gynecology | Admitting: Obstetrics & Gynecology

## 2013-11-21 DIAGNOSIS — N63 Unspecified lump in unspecified breast: Secondary | ICD-10-CM

## 2013-11-22 ENCOUNTER — Other Ambulatory Visit: Payer: Self-pay

## 2013-11-24 ENCOUNTER — Other Ambulatory Visit: Payer: Self-pay

## 2014-05-25 ENCOUNTER — Other Ambulatory Visit: Payer: Self-pay

## 2014-05-25 DIAGNOSIS — Z1231 Encounter for screening mammogram for malignant neoplasm of breast: Secondary | ICD-10-CM

## 2014-05-31 ENCOUNTER — Ambulatory Visit: Payer: Self-pay

## 2014-05-31 ENCOUNTER — Ambulatory Visit
Admission: RE | Admit: 2014-05-31 | Discharge: 2014-05-31 | Disposition: A | Discharge status: Home / Self Care | Payer: 59 | Source: Ambulatory Visit

## 2014-05-31 DIAGNOSIS — Z1231 Encounter for screening mammogram for malignant neoplasm of breast: Secondary | ICD-10-CM

## 2014-06-02 ENCOUNTER — Telehealth: Payer: Self-pay | Admitting: Family Medicine

## 2014-06-02 MED ORDER — VALACYCLOVIR HCL 1 G PO TABS: 1000.0000 mg | ORAL_TABLET | Freq: Three times a day (TID) | ORAL | Status: DC

## 2014-06-02 NOTE — Telephone Encounter (Signed)
Rx faxed.    KP 

## 2014-06-02 NOTE — Telephone Encounter (Signed)
Relation to pt: self Call back number:[phone] Pharmacy [facility] - [place], [place] - [address] [phone] (Phone) [phone] (Fax)       Reason for call:  Pt requesting a refill valACYclovir (VALTREX) 1000 MG tablet

## 2015-06-12 ENCOUNTER — Other Ambulatory Visit: Payer: Self-pay

## 2015-06-12 DIAGNOSIS — Z1231 Encounter for screening mammogram for malignant neoplasm of breast: Secondary | ICD-10-CM

## 2015-06-14 ENCOUNTER — Other Ambulatory Visit: Payer: Self-pay | Admitting: Obstetrics & Gynecology

## 2015-06-14 DIAGNOSIS — Z803 Family history of malignant neoplasm of breast: Secondary | ICD-10-CM

## 2015-06-27 ENCOUNTER — Ambulatory Visit
Admission: RE | Admit: 2015-06-27 | Discharge: 2015-06-27 | Disposition: A | Discharge status: Home / Self Care | Payer: 59 | Source: Ambulatory Visit

## 2015-06-27 DIAGNOSIS — Z1231 Encounter for screening mammogram for malignant neoplasm of breast: Secondary | ICD-10-CM

## 2015-07-09 ENCOUNTER — Inpatient Hospital Stay: Admission: RE | Admit: 2015-07-09 | Payer: Self-pay | Source: Ambulatory Visit

## 2015-07-18 ENCOUNTER — Ambulatory Visit
Admission: RE | Admit: 2015-07-18 | Discharge: 2015-07-18 | Disposition: A | Discharge status: Home / Self Care | Payer: 59 | Source: Ambulatory Visit | Attending: Obstetrics & Gynecology | Admitting: Obstetrics & Gynecology

## 2015-07-18 DIAGNOSIS — Z803 Family history of malignant neoplasm of breast: Secondary | ICD-10-CM

## 2015-07-18 MED ORDER — GADOBENATE DIMEGLUMINE 529 MG/ML IV SOLN
15.0000 mL | Freq: Once | INTRAVENOUS | Status: AC | PRN
  Administered 2015-07-18: 15 mL via INTRAVENOUS

## 2015-12-24 ENCOUNTER — Telehealth: Payer: Self-pay | Admitting: Family Medicine

## 2015-12-24 NOTE — Telephone Encounter (Signed)
Caller name: Relationship to patient: Self Can be reached: [phone]  [facility]:  [facility] [place] [address] [phone] (Phone) [phone])     Reason for call: Refill on valACYclovir (VALTREX) 1000 MG tablet [[id number]  Patient request call when Rx is sent. [phone]

## 2015-12-26 ENCOUNTER — Other Ambulatory Visit: Payer: Self-pay

## 2015-12-26 MED ORDER — VALACYCLOVIR HCL 1 G PO TABS: 1000.0000 mg | ORAL_TABLET | Freq: Three times a day (TID) | ORAL | 0 refills | Status: DC

## 2015-12-26 NOTE — Telephone Encounter (Signed)
Left message on pt's vm to call the office to make an appointment to be seen by provider in order to receive future Rx refill, pt has not been seen by provider since 06/02/14. Rx sent to pharmacy. LB

## 2016-07-25 ENCOUNTER — Encounter: Payer: Self-pay | Admitting: Family Medicine

## 2016-07-25 ENCOUNTER — Ambulatory Visit (INDEPENDENT_AMBULATORY_CARE_PROVIDER_SITE_OTHER): Payer: Managed Care, Other (non HMO) | Admitting: Family Medicine

## 2016-07-25 ENCOUNTER — Telehealth: Payer: Self-pay | Admitting: Family Medicine

## 2016-07-25 VITALS — BP 100/58 | HR 62 | Temp 98.2°F | Resp 16 | Ht 68.5 in | Wt 166.2 lb

## 2016-07-25 DIAGNOSIS — J0140 Acute pansinusitis, unspecified: Secondary | ICD-10-CM | POA: Diagnosis not present

## 2016-07-25 DIAGNOSIS — B001 Herpesviral vesicular dermatitis: Secondary | ICD-10-CM

## 2016-07-25 MED ORDER — AMOXICILLIN-POT CLAVULANATE 875-125 MG PO TABS: 1.0000 | ORAL_TABLET | Freq: Two times a day (BID) | ORAL | 0 refills | Status: DC

## 2016-07-25 MED ORDER — ACYCLOVIR 5 % EX OINT: 1.0000 "application " | TOPICAL_OINTMENT | CUTANEOUS | 1 refills | Status: DC

## 2016-07-25 NOTE — Telephone Encounter (Signed)
rx sent to [facility] and patient notified.

## 2016-07-25 NOTE — Telephone Encounter (Signed)
Pt called in she said that she was suppose to receive an cream (Zovirax) and have it sent to pharmacy. Pt says that pharmacy states that Rx hasn't been received. She would like to have it sent to the [facility] [address]
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
## 2016-07-25 NOTE — Patient Instructions (Signed)

## 2016-11-10 ENCOUNTER — Other Ambulatory Visit: Payer: Self-pay | Admitting: Obstetrics & Gynecology

## 2016-11-10 DIAGNOSIS — Z1239 Encounter for other screening for malignant neoplasm of breast: Secondary | ICD-10-CM

## 2016-11-27 ENCOUNTER — Ambulatory Visit
Admission: RE | Admit: 2016-11-27 | Discharge: 2016-11-27 | Disposition: A | Discharge status: Home / Self Care | Payer: 59 | Source: Ambulatory Visit | Attending: Obstetrics & Gynecology | Admitting: Obstetrics & Gynecology

## 2016-11-27 DIAGNOSIS — Z1239 Encounter for other screening for malignant neoplasm of breast: Secondary | ICD-10-CM

## 2016-12-23 ENCOUNTER — Encounter: Payer: Self-pay | Admitting: Obstetrics & Gynecology

## 2016-12-23 ENCOUNTER — Ambulatory Visit (INDEPENDENT_AMBULATORY_CARE_PROVIDER_SITE_OTHER): Payer: 59 | Admitting: Obstetrics & Gynecology

## 2016-12-23 VITALS — BP 110/68 | Ht 68.25 in | Wt 164.0 lb

## 2016-12-23 DIAGNOSIS — Z01419 Encounter for gynecological examination (general) (routine) without abnormal findings: Secondary | ICD-10-CM | POA: Diagnosis not present

## 2016-12-23 DIAGNOSIS — Z3041 Encounter for surveillance of contraceptive pills: Secondary | ICD-10-CM | POA: Diagnosis not present

## 2016-12-23 NOTE — Progress Notes (Signed)
Laura [name] Apr 02, 1960 [id number]   History:    56 y.o. G3P3 Married.  Children doing well, 2 girls, 1 boy.  Just had to put down 64 yo dog.  RP:  Established patient presenting for annual gyn exam   HPI: Well on Junel FE 1/20.  No hot flashes or night sweats.  No pelvic pain.  No pain with intercourse.  Breasts normal.  Urine and bowel movements normal.  Past medical history,surgical history, family history and social history were all reviewed and documented in the EPIC chart.  Gynecologic History Patient's last menstrual period was 11/25/2016. Contraception: OCP (estrogen/progesterone) Last Pap: 2017. Results were: normal Last mammogram: 2018. Results were: normal Colonoscopy 2018  Obstetric History OB History  Gravida Para Term Preterm AB Living  3 3       3   SAB TAB Ectopic Multiple Live Births               # Outcome Date GA Lbr Len/2nd Weight Sex Delivery Anes PTL Lv  3 Para           2 Para           1 Para                ROS: A ROS was performed and pertinent positives and negatives are included in the history.  GENERAL: No fevers or chills. HEENT: No change in vision, no earache, sore throat or sinus congestion. NECK: No pain or stiffness. CARDIOVASCULAR: No chest pain or pressure. No palpitations. PULMONARY: No shortness of breath, cough or wheeze. GASTROINTESTINAL: No abdominal pain, nausea, vomiting or diarrhea, melena or bright red blood per rectum. GENITOURINARY: No urinary frequency, urgency, hesitancy or dysuria. MUSCULOSKELETAL: No joint or muscle pain, no back pain, no recent trauma. DERMATOLOGIC: No rash, no itching, no lesions. ENDOCRINE: No polyuria, polydipsia, no heat or cold intolerance. No recent change in weight. HEMATOLOGICAL: No anemia or easy bruising or bleeding. NEUROLOGIC: No headache, seizures, numbness, tingling or weakness. PSYCHIATRIC: No depression, no loss of interest in normal activity or change in sleep pattern.     Exam:   BP  110/68   Ht 5' 8.25" (1.734 m)   Wt 164 lb (74.4 kg)   LMP 11/25/2016 Comment: pill  BMI 24.75 kg/m   Body mass index is 24.75 kg/m.  General appearance : Well developed well nourished female. No acute distress HEENT: Eyes: no retinal hemorrhage or exudates,  Neck supple, trachea midline, no carotid bruits, no thyroidmegaly Lungs: Clear to auscultation, no rhonchi or wheezes, or rib retractions  Heart: Regular rate and rhythm, no murmurs or gallops Breast:Examined in sitting and supine position were symmetrical in appearance, no palpable masses or tenderness,  no skin retraction, no nipple inversion, no nipple discharge, no skin discoloration, no axillary or supraclavicular lymphadenopathy Abdomen: no palpable masses or tenderness, no rebound or guarding Extremities: no edema or skin discoloration or tenderness  Pelvic: Vulva normal  Bartholin, Urethra, Skene Glands: Within normal limits             Vagina: No gross lesions or discharge  Cervix: No gross lesions or discharge.  Pap reflex done.  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 female for annual exam  1. Encounter for routine gynecological examination with Papanicolaou smear of cervix Normal gynecologic exam.  Pap reflex done.  Breast exam normal.  Screening mammogram normal in 2018.  Health labs with family physician.  Colonoscopy 2018. - Pap IG w/ reflex to HPV when ASC-U  2. Encounter for surveillance of contraceptive pills No clinical evidence of menopause.  No contraindication to birth control pill.  Well on Junel FE 1/20.  Birth control pill represcribed.  Recommended to come for an [facility] level after the week off of hormonal pills if having menopausal symptoms.  If not will do the [facility] at next annual exam.  Genia Del MD, 4:30 PM 12/23/2016

## 2016-12-27 NOTE — Patient Instructions (Signed)
1. Encounter for routine gynecological examination with Papanicolaou smear of cervix Normal gynecologic exam.  Pap reflex done.  Breast exam normal.  Screening mammogram normal in 2018.  Health labs with family physician.  Colonoscopy 2018. - Pap IG w/ reflex to HPV when ASC-U  2. Encounter for surveillance of contraceptive pills No clinical evidence of menopause.  No contraindication to birth control pill.  Well on Junel FE 1/20.  Birth control pill represcribed.  Recommended to come for an [facility] level after the week off of hormonal pills if having menopausal symptoms.  If not will do the [facility] at next annual exam.  Emnet, it was a pleasure seeing you today!  I will inform you of your results as soon as available.  Health Maintenance, Female Adopting a healthy lifestyle and getting preventive care can go a long way to promote health and wellness. Talk with your health care provider about what schedule of regular examinations is right for you. This is a good chance for you to check in with your provider about disease prevention and staying healthy. In between checkups, there are plenty of things you can do on your own. Experts have done a lot of research about which lifestyle changes and preventive measures are most likely to keep you healthy. Ask your health care provider for more information. Weight and diet Eat a healthy diet  Be sure to include plenty of vegetables, fruits, low-fat dairy products, and lean protein.  Do not eat a lot of foods high in solid fats, added sugars, or salt.  Get regular exercise. This is one of the most important things you can do for your health. ? Most adults should exercise for at least 150 minutes each week. The exercise should increase your heart rate and make you sweat (moderate-intensity exercise). ? Most adults should also do strengthening exercises at least twice a week. This is in addition to the moderate-intensity exercise.  Maintain a healthy  weight  Body mass index (BMI) is a measurement that can be used to identify possible weight problems. It estimates body fat based on height and weight. Your health care provider can help determine your BMI and help you achieve or maintain a healthy weight.  For females 78 years of age and older: ? A BMI below 18.5 is considered underweight. ? A BMI of 18.5 to 24.9 is normal. ? A BMI of 25 to 29.9 is considered overweight. ? A BMI of 30 and above is considered obese.  Watch levels of cholesterol and blood lipids  You should start having your blood tested for lipids and cholesterol at 54 years of age, then have this test every 5 years.  You may need to have your cholesterol levels checked more often if: ? Your lipid or cholesterol levels are high. ? You are older than 54 years of age. ? You are at high risk for heart disease.  Cancer screening Lung Cancer  Lung cancer screening is recommended for adults 23-13 years old who are at high risk for lung cancer because of a history of smoking.  A yearly low-dose CT scan of the lungs is recommended for people who: ? Currently smoke. ? Have quit within the past 15 years. ? Have at least a 30-pack-year history of smoking. A pack year is smoking an average of one pack of cigarettes a day for 1 year.  Yearly screening should continue until it has been 15 years since you quit.  Yearly screening should stop if you develop  a health problem that would prevent you from having lung cancer treatment.  Breast Cancer  Practice breast self-awareness. This means understanding how your breasts normally appear and feel.  It also means doing regular breast self-exams. Let your health care provider know about any changes, no matter how small.  If you are in your 20s or 30s, you should have a clinical breast exam (CBE) by a health care provider every 1-3 years as part of a regular health exam.  If you are 59 or older, have a CBE every year. Also consider  having a breast X-ray (mammogram) every year.  If you have a family history of breast cancer, talk to your health care provider about genetic screening.  If you are at high risk for breast cancer, talk to your health care provider about having an MRI and a mammogram every year.  Breast cancer gene (BRCA) assessment is recommended for women who have family members with BRCA-related cancers. BRCA-related cancers include: ? Breast. ? Ovarian. ? Tubal. ? Peritoneal cancers.  Results of the assessment will determine the need for genetic counseling and BRCA1 and BRCA2 testing.  Cervical Cancer Your health care provider may recommend that you be screened regularly for cancer of the pelvic organs (ovaries, uterus, and vagina). This screening involves a pelvic examination, including checking for microscopic changes to the surface of your cervix (Pap test). You may be encouraged to have this screening done every 3 years, beginning at age 21.  For women ages 29-65, health care providers may recommend pelvic exams and Pap testing every 3 years, or they may recommend the Pap and pelvic exam, combined with testing for human papilloma virus (HPV), every 5 years. Some types of HPV increase your risk of cervical cancer. Testing for HPV may also be done on women of any age with unclear Pap test results.  Other health care providers may not recommend any screening for nonpregnant women who are considered low risk for pelvic cancer and who do not have symptoms. Ask your health care provider if a screening pelvic exam is right for you.  If you have had past treatment for cervical cancer or a condition that could lead to cancer, you need Pap tests and screening for cancer for at least 20 years after your treatment. If Pap tests have been discontinued, your risk factors (such as having a new sexual partner) need to be reassessed to determine if screening should resume. Some women have medical problems that increase  the chance of getting cervical cancer. In these cases, your health care provider may recommend more frequent screening and Pap tests.  Colorectal Cancer  This type of cancer can be detected and often prevented.  Routine colorectal cancer screening usually begins at 54 years of age and continues through 54 years of age.  Your health care provider may recommend screening at an earlier age if you have risk factors for colon cancer.  Your health care provider may also recommend using home test kits to check for hidden blood in the stool.  A small camera at the end of a tube can be used to examine your colon directly (sigmoidoscopy or colonoscopy). This is done to check for the earliest forms of colorectal cancer.  Routine screening usually begins at age 62.  Direct examination of the colon should be repeated every 5-10 years through 54 years of age. However, you may need to be screened more often if early forms of precancerous polyps or small growths are found.  Skin Cancer  Check your skin from head to toe regularly.  Tell your health care provider about any new moles or changes in moles, especially if there is a change in a mole's shape or color.  Also tell your health care provider if you have a mole that is larger than the size of a pencil eraser.  Always use sunscreen. Apply sunscreen liberally and repeatedly throughout the day.  Protect yourself by wearing long sleeves, pants, a wide-brimmed hat, and sunglasses whenever you are outside.  Heart disease, diabetes, and high blood pressure  High blood pressure causes heart disease and increases the risk of stroke. High blood pressure is more likely to develop in: ? People who have blood pressure in the high end of the normal range (130-139/85-89 mm Hg). ? People who are overweight or obese. ? People who are African American.  If you are 4-68 years of age, have your blood pressure checked every 3-5 years. If you are 79 years of age  or older, have your blood pressure checked every year. You should have your blood pressure measured twice-once when you are at a hospital or clinic, and once when you are not at a hospital or clinic. Record the average of the two measurements. To check your blood pressure when you are not at a hospital or clinic, you can use: ? An automated blood pressure machine at a pharmacy. ? A home blood pressure monitor.  If you are between 12 years and 52 years old, ask your health care provider if you should take aspirin to prevent strokes.  Have regular diabetes screenings. This involves taking a blood sample to check your fasting blood sugar level. ? If you are at a normal weight and have a low risk for diabetes, have this test once every three years after 54 years of age. ? If you are overweight and have a high risk for diabetes, consider being tested at a younger age or more often. Preventing infection Hepatitis B  If you have a higher risk for hepatitis B, you should be screened for this virus. You are considered at high risk for hepatitis B if: ? You were born in a country where hepatitis B is common. Ask your health care provider which countries are considered high risk. ? Your parents were born in a high-risk country, and you have not been immunized against hepatitis B (hepatitis B vaccine). ? You have HIV or AIDS. ? You use needles to inject street drugs. ? You live with someone who has hepatitis B. ? You have had sex with someone who has hepatitis B. ? You get hemodialysis treatment. ? You take certain medicines for conditions, including cancer, organ transplantation, and autoimmune conditions.  Hepatitis C  Blood testing is recommended for: ? Everyone born from [birth year] through 1963. ? Anyone with known risk factors for hepatitis C.  Sexually transmitted infections (STIs)  You should be screened for sexually transmitted infections (STIs) including gonorrhea and chlamydia if: ? You are  sexually active and are younger than 54 years of age. ? You are older than 54 years of age and your health care provider tells you that you are at risk for this type of infection. ? Your sexual activity has changed since you were last screened and you are at an increased risk for chlamydia or gonorrhea. Ask your health care provider if you are at risk.  If you do not have HIV, but are at risk, it may be recommended that you  take a prescription medicine daily to prevent HIV infection. This is called pre-exposure prophylaxis (PrEP). You are considered at risk if: ? You are sexually active and do not regularly use condoms or know the HIV status of your partner(s). ? You take drugs by injection. ? You are sexually active with a partner who has HIV.  Talk with your health care provider about whether you are at high risk of being infected with HIV. If you choose to begin PrEP, you should first be tested for HIV. You should then be tested every 3 months for as long as you are taking PrEP. Pregnancy  If you are premenopausal and you may become pregnant, ask your health care provider about preconception counseling.  If you may become pregnant, take 400 to 800 micrograms (mcg) of folic acid every day.  If you want to prevent pregnancy, talk to your health care provider about birth control (contraception). Osteoporosis and menopause  Osteoporosis is a disease in which the bones lose minerals and strength with aging. This can result in serious bone fractures. Your risk for osteoporosis can be identified using a bone density scan.  If you are 38 years of age or older, or if you are at risk for osteoporosis and fractures, ask your health care provider if you should be screened.  Ask your health care provider whether you should take a calcium or vitamin D supplement to lower your risk for osteoporosis.  Menopause may have certain physical symptoms and risks.  Hormone replacement therapy may reduce some  of these symptoms and risks. Talk to your health care provider about whether hormone replacement therapy is right for you. Follow these instructions at home:  Schedule regular health, dental, and eye exams.  Stay current with your immunizations.  Do not use any tobacco products including cigarettes, chewing tobacco, or electronic cigarettes.  If you are pregnant, do not drink alcohol.  If you are breastfeeding, limit how much and how often you drink alcohol.  Limit alcohol intake to no more than 1 drink per day for nonpregnant women. One drink equals 12 ounces of beer, 5 ounces of wine, or 1 ounces of hard liquor.  Do not use street drugs.  Do not share needles.  Ask your health care provider for help if you need support or information about quitting drugs.  Tell your health care provider if you often feel depressed.  Tell your health care provider if you have ever been abused or do not feel safe at home. This information is not intended to replace advice given to you by your health care provider. Make sure you discuss any questions you have with your health care provider. Document Released: 07/29/2010 Document Revised: 06/21/2015 Document Reviewed: 10/17/2014 Elsevier Interactive Patient Education  [employer].

## 2016-12-29 LAB — PAP IG W/ RFLX HPV ASCU

## 2017-03-03 ENCOUNTER — Telehealth: Payer: Self-pay | Admitting: *Deleted

## 2017-03-03 MED ORDER — NORETHIN ACE-ETH ESTRAD-FE 1-20 MG-MCG PO TABS: 1.0000 | ORAL_TABLET | Freq: Every day | ORAL | 3 refills | Status: DC

## 2017-03-03 NOTE — Telephone Encounter (Signed)
Pt never received from [facility] on 12/23/16 for birth control pills. Rx sent.

## 2017-03-09 ENCOUNTER — Ambulatory Visit: Payer: Self-pay

## 2017-03-09 NOTE — Telephone Encounter (Signed)
.   Pt states she is coughing up blood flecks and blood that is mixed in phlegm from the back of her throat. Pt c/o sore throat and hoarseness following a cold. Pt has runny nose and chest congestion. Appt made for tomorrow with PCP at 1100.  Reason for Disposition . [1] Coughed up blood-tinged sputum AND [2] more than once  Answer Assessment - Initial Assessment Questions 1. ONSET: "When did the cough begin?"      2 weeks 2. SEVERITY: "How bad is the cough today?"      Cough is used to clear back throat 3. RESPIRATORY DISTRESS: "Describe your breathing."     "im ok 4. FEVER: "Do you have a fever?" If so, ask: "What is your temperature, how was it measured, and when did it start?"     no 5. SPUTUM: "Describe the color of your sputum" (clear, white, yellow, green)     Clear  6. HEMOPTYSIS: "Are you coughing up any blood?" If so ask: "How much?" (flecks, streaks, tablespoons, etc.)     Flecks dk red 7. CARDIAC HISTORY: "Do you have any history of heart disease?" (e.g., heart attack, congestive heart failure)      Mitral valve prolaspe 8. LUNG HISTORY: "Do you have any history of lung disease?"  (e.g., pulmonary embolus, asthma, emphysema)     no 9. PE RISK FACTORS: "Do you have a history of blood clots?" (or: recent major surgery, recent prolonged travel, bedridden )     no 10. OTHER SYMPTOMS: "Do you have any other symptoms?" (e.g., runny nose, wheezing, chest pain)       Runny nose more congested than anything 11. PREGNANCY: "Is there any chance you are pregnant?" "When was your last menstrual period?"       N/a  12. TRAVEL: "Have you traveled out of the country in the last month?" (e.g., travel history, exposures)       no  Protocols used: COUGH - ACUTE PRODUCTIVE-A-AH

## 2017-03-10 ENCOUNTER — Encounter: Payer: Self-pay | Admitting: Family Medicine

## 2017-03-10 ENCOUNTER — Ambulatory Visit (INDEPENDENT_AMBULATORY_CARE_PROVIDER_SITE_OTHER): Payer: 59 | Admitting: Family Medicine

## 2017-03-10 VITALS — BP 126/70 | HR 68 | Temp 98.8°F | Resp 16 | Ht 68.25 in | Wt 165.2 lb

## 2017-03-10 DIAGNOSIS — R1013 Epigastric pain: Secondary | ICD-10-CM | POA: Diagnosis not present

## 2017-03-10 MED ORDER — OMEPRAZOLE 20 MG PO CPDR: 20.0000 mg | DELAYED_RELEASE_CAPSULE | Freq: Every day | ORAL | 3 refills | Status: DC
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
## 2017-03-10 NOTE — Patient Instructions (Signed)
Food Choices for Gastroesophageal Reflux Disease, Adult When you have gastroesophageal reflux disease (GERD), the foods you eat and your eating habits are very important. Choosing the right foods can help ease your discomfort. What guidelines do I need to follow?  Choose fruits, vegetables, whole grains, and low-fat dairy products.  Choose low-fat meat, fish, and poultry.  Limit fats such as oils, salad dressings, butter, nuts, and avocado.  Keep a food diary. This helps you identify foods that cause symptoms.  Avoid foods that cause symptoms. These may be different for everyone.  Eat small meals often instead of 3 large meals a day.  Eat your meals slowly, in a place where you are relaxed.  Limit fried foods.  Cook foods using methods other than frying.  Avoid drinking alcohol.  Avoid drinking large amounts of liquids with your meals.  Avoid bending over or lying down until 2-3 hours after eating. What foods are not recommended? These are some foods and drinks that may make your symptoms worse: Vegetables  Tomatoes. Tomato juice. Tomato and spaghetti sauce. Chili peppers. Onion and garlic. Horseradish. Fruits  Oranges, grapefruit, and lemon (fruit and juice). Meats  High-fat meats, fish, and poultry. This includes hot dogs, ribs, ham, sausage, salami, and bacon. Dairy  Whole milk and chocolate milk. Sour cream. Cream. Butter. Ice cream. Cream cheese. Drinks  Coffee and tea. Bubbly (carbonated) drinks or energy drinks. Condiments  Hot sauce. Barbecue sauce. Sweets/Desserts  Chocolate and cocoa. Donuts. Peppermint and spearmint. Fats and Oils  High-fat foods. This includes French fries and potato chips. Other  Vinegar. Strong spices. This includes black pepper, white pepper, red pepper, cayenne, curry powder, cloves, ginger, and chili powder. The items listed above may not be a complete list of foods and drinks to avoid. Contact your dietitian for more information.    This information is not intended to replace advice given to you by your health care provider. Make sure you discuss any questions you have with your health care provider. Document Released: 07/15/2011 Document Revised: 06/21/2015 Document Reviewed: 11/17/2012 Elsevier Interactive Patient Education  2017 [employer] Inc.  

## 2017-08-31 ENCOUNTER — Telehealth: Payer: Self-pay

## 2017-08-31 NOTE — Telephone Encounter (Signed)
Last OV notes faxed to Dr. Hulen ShoutsMunley's office at fax # [phone] as requested.    Copied from CRM [id number]. Topic: Quick Communication - See Telephone Encounter >> Aug 25, 2017 10:18 AM Tamela OddiMartin, Don'[place], NT wrote: CRM for notification. See Telephone encounter for: 08/25/17. Laurelyn SickleKatina calling from [facility] states the patient is being seen on 09/04/17 by Dr. Dulce SellarMunley Cardiologist. She states the cardiologist needs the last OV be sent to the office prior to the patient being seen . Please fax  FAX #[phone]
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
## 2017-09-04 ENCOUNTER — Ambulatory Visit (INDEPENDENT_AMBULATORY_CARE_PROVIDER_SITE_OTHER): Payer: 59 | Admitting: Cardiology

## 2017-09-04 ENCOUNTER — Encounter: Payer: Self-pay | Admitting: Cardiology

## 2017-09-04 DIAGNOSIS — I341 Nonrheumatic mitral (valve) prolapse: Secondary | ICD-10-CM

## 2017-09-04 DIAGNOSIS — I340 Nonrheumatic mitral (valve) insufficiency: Secondary | ICD-10-CM | POA: Diagnosis not present

## 2017-09-04 DIAGNOSIS — I493 Ventricular premature depolarization: Secondary | ICD-10-CM | POA: Diagnosis not present

## 2017-09-04 NOTE — Patient Instructions (Addendum)
Medication Instructions:  Your physician recommends that you continue on your current medications as directed. Please refer to the Current Medication list given to you today.  Labwork: Your physician recommends that you return for lab work today: CMP, TSH.   Testing/Procedures: You had an EKG today.   Your physician has requested that you have an echocardiogram. Echocardiography is a painless test that uses sound waves to create images of your heart. It provides your doctor with information about the size and shape of your heart and how well your heart's chambers and valves are working. This procedure takes approximately one hour. There are no restrictions for this procedure.  Follow-Up: Your physician wants you to follow-up in: 1 year. You will receive a reminder letter in the mail two months in advance. If you don't receive a letter, please call our office to schedule the follow-up appointment.  If you need a refill on your cardiac medications before your next appointment, please call your pharmacy.   Thank you for choosing [facility]! Robyne Peers, RN [phone]    Mitral Valve Prolapse Mitral valve prolapse is a heart condition involving the mitral valve. This is the valve between the upper chamber (atrium) and the lower chamber (ventricle) on the left side of the heart. Normally, the mitral valve allows blood to flow from the atrium to the ventricle and then seals off the chambers from one another. If you have mitral valve prolapse, the valve does not work the way that it should. The flaps of the mitral valve do not form a tight seal between the atrium and the ventricle. When this happens, blood can flow the wrong way (mitral valve regurgitation). This causes symptoms of mitral valve prolapse. This condition can develop if:  The mitral valve flaps are larger and thicker than normal.  The valve opening stretches abnormally.  The valve flaps flop or bulge more than they  should.  What are the causes? The cause of this condition is not known. However:  It is sometimes passed down (inherited) from a family member who also had the condition.  It can be a complication of other diseases.  What increases the risk? This condition is more like to develop in people with:  A family history of mitral valve prolapse.  Graves disease.  Scoliosis.  A connective tissue disorder.  What are the signs or symptoms? Symptoms of this condition include:  A fast or irregular heartbeat (palpitations).  Fatigue.  Dizziness.  Shortness of breath.  Discomfort in the chest area.  In some cases, there are no symptoms for this condition. How is this diagnosed? This condition may be diagnosed based on:  Your symptoms and medical history.  A physical exam, which includes listening to your heart with a stethoscope.  Other tests to confirm the diagnosis. These may include imaging studies of your heart, such as: ? X-rays. These check for fluid in the lungs. ? Echocardiogram. This test uses sound waves to show the size of your heart and how well it pumps and detail of the mtiral valve. ? Doppler ultrasound. This test uses sound waves to take pictures of the blood flow through your valve. ? Electrocardiogram (ECG). This test records the electrical activity of your heart.  How is this treated?  We no longer use antibiotic prophylaxis for dental procedures for MVP  Treatment for this condition depends on how severe your symptoms are. If you need treatment, the goal is to relieve symptoms and prevent further problems, such as  a type of heart infection called endocarditis. Treatment can include:  Medicines. You may need to take: ? Beta blockers. These help with chest discomfort and palpitations. ? Vasodilators. These improve forward blood flow through the valve, if it is leaking. ? Water pills (diuretics). These get rid of any extra fluid that is present.  Surgery  to repair or replace the mitral valve. This is unlikely seen in 1% of those with MVP. If you do not have symptoms, you may not need treatment.  Follow these instructions at home:  Take over-the-counter and prescription medicines only as told by your health care provider.  Get regular exercise. Ask your health care provider to recommend some activities that are safe for you to do.  Do not use any products that contain nicotine or tobacco, such as cigarettes and e-cigarettes. If you need help quitting, ask your health care provider.  Avoid being around secondhand smoke.  Keep all follow-up visits as told by your health care provider. This is important.   Contact a health care provider if:  You have any kind of abnormal heartbeat.  You are often very tired.  You have a cough that will not go away.  Your symptoms of mitral valve prolapse begin to get worse. Get help right away if:  You have chest pain or shortness of breath.  You start to have chills, body aches, and a fever.   This information is not intended to replace advice given to you by your health care provider. Make sure you discuss any questions you have with your health care provider. Document Released: 01/11/2000 Document Revised: 09/11/2015 Document Reviewed: 07/07/2015 Elsevier Interactive Patient Education  2018 [employer].  1. Avoid all over-the-counter antihistamines except Claritin/Loratadine and Zyrtec/Cetrizine. 2. Avoid all combination including cold sinus allergies flu decongestant and sleep medications 3. You can use Robitussin DM Mucinex and Mucinex DM for cough. 4. can use Tylenol aspirin ibuprofen and naproxen but no combinations such as sleep or sinus.    Echocardiogram An echocardiogram, or echocardiography, uses sound waves (ultrasound) to produce an image of your heart. The echocardiogram is simple, painless, obtained within a short period of time, and offers valuable information to your health  care provider. The images from an echocardiogram can provide information such as:  Evidence of coronary artery disease (CAD).  Heart size.  Heart muscle function.  Heart valve function.  Aneurysm detection.  Evidence of a past heart attack.  Fluid buildup around the heart.  Heart muscle thickening.  Assess heart valve function.  Tell a health care provider about:  Any allergies you have.  All medicines you are taking, including vitamins, herbs, eye drops, creams, and over-the-counter medicines.  Any problems you or family members have had with anesthetic medicines.  Any blood disorders you have.  Any surgeries you have had.  Any medical conditions you have.  Whether you are pregnant or may be pregnant. What happens before the procedure? No special preparation is needed. Eat and drink normally. What happens during the procedure?  In order to produce an image of your heart, gel will be applied to your chest and a wand-like tool (transducer) will be moved over your chest. The gel will help transmit the sound waves from the transducer. The sound waves will harmlessly bounce off your heart to allow the heart images to be captured in real-time motion. These images will then be recorded.  You may need an IV to receive a medicine that improves the quality of the  pictures. What happens after the procedure? You may return to your normal schedule including diet, activities, and medicines, unless your health care provider tells you otherwise. This information is not intended to replace advice given to you by your health care provider. Make sure you discuss any questions you have with your health care provider. Document Released: 01/11/2000 Document Revised: 09/01/2015 Document Reviewed: 09/20/2012 Elsevier Interactive Patient Education  2017 [place] Hormone Test Why am I having this test? A thyroid-stimulating hormone (TSH) test is a blood test  that is done to measure the level of TSH, also known as thyrotropin, in your blood. TSH is produced by the pituitary gland. The pituitary gland is a small organ located just below the brain, behind your eyes and nasal passages. It is part of a system that monitors and maintains thyroid hormone levels and thyroid gland function. Thyroid hormones affect many body parts and systems, including the system that affects how quickly your body burns fuel for energy. Your health care provider may recommend testing your TSH level if you have signs and symptoms of abnormal thyroid hormone levels. Knowing the level of TSH in your blood can help your health care provider:  Diagnose a thyroid gland or pituitary gland disorder.  Manage your condition and treatment if you have hypothyroidism or hyperthyroidism.  What kind of sample is taken? A blood sample is required for this test. It is usually collected by inserting a needle into a vein. How do I prepare for this test? There is no preparation required for this test. What are the reference ranges? Reference rangesare considered healthy rangesestablished after testing a large group of healthy people. Reference rangesmay vary among different people, labs, and hospitals. It is your responsibility to obtain your test results. Ask the lab or department performing the test when and how you will get your results. Range of Normal Values:  Adult: 0.3-5 microunits/mL or 0.3-5 milliunits/L (SI units).  Newborn: 77-18 microunits/mL or 3-18 milliunits/L.  Cord: 3-12 microunits/mL or 3-12 milliunits/L.  What do the results mean? A high level of TSH may mean:  Your thyroid gland is not making enough thyroid hormones. When the thyroid gland does not make enough thyroid hormones, the pituitary gland releases TSH into the bloodstream. The higher-than-normal levels of TSH prompt the thyroid gland to release more thyroid hormones.  You are getting an insufficient level of  thyroid hormone medicine, if you are receiving this type of treatment.  There is a problem with the pituitary gland (rare).  A low level of TSH can indicate a problem with the pituitary gland. Talk with your health care provider to discuss your results, treatment options, and if necessary, the need for more tests. Talk with your health care provider if you have any questions about your results. Talk with your health care provider to discuss your results, treatment options, and if necessary, the need for more tests. Talk with your health care provider if you have any questions about your results. This information is not intended to replace advice given to you by your health care provider. Make sure you discuss any questions you have with your health care provider. Document Released: 02/08/2004 Document Revised: 09/16/2015 Document Reviewed: 06/08/2013 Elsevier Interactive Patient Education  2018 [employer].   Comprehensive Metabolic Panel The comprehensive metabolic panel (CMP) measures levels of the following substances in your blood:  Glucose. Glucose is a simple sugar that serves as the main source of energy for your body.  Creatinine. Creatinine is a waste product of normal muscle activity. It is excreted from the body by the kidneys.  Blood urea nitrogen (BUN). Urea nitrogen is a waste product of protein breakdown. It is produced when excess protein in your body is broken down and used for energy. It is excreted by the kidneys.  Electrolytes. Electrolytes are negatively or positively charged particles that are dissolved in the water of different body compartments. This includes the serum portion of blood, water inside cells, and water outside cells. Concentrations of electrolytes vary among the different fluid compartments. Electrolytes are tightly regulated to maintain a salt-water and acid-base balance in the body. The electrolytes  include: ? Potassium. ? Sodium. ? Chloride. ? Calcium. ? Bicarbonate.  Alkaline phosphatase (ALP). This is a protein found in all tissues of your body. The bones, liver, and bile ducts have the highest amounts.  Alanine aminotransferase (ALT). ALT is an enzyme found throughout the body. It is present in the highest concentrations in the tissues of the liver. If your liver is injured, there will also be ALT in your bloodstream.  Aspartate aminotransferase (AST). This is another enzyme found mostly in your liver. There is also a high concentration in your muscle cells and heart. Testing for AST is helpful to check for liver damage.  Bilirubin. As the liver breaks down red blood cells, it produces a waste product called bilirubin. A high level of bilirubin can indicate certain health problems.  Albumin. This is a protein that is a major component of the liquid part of blood (serum). It is made by the liver and can be measured in the bloodstream.  Total protein. This is a measurement of the amount of the two types of protein found in blood serum. The two proteins are albumin and globulins. Globulins are part of your immune system.  A comprehensive metabolic panel requires a blood sample taken from a vein in your arm or hand. How do I prepare for this test? Your health care provider may ask you not to eat or drink anything for 6-8 hours before your blood sample is taken. What do the results mean? It is your responsibility to obtain your test results. Ask the lab or department performing the test when and how you will get your results. Contact your health care provider to discuss any questions you have about your results. RANGE OF NORMAL VALUES Ranges for normal values may vary among different labs and hospitals. You should always check with your health care provider after having lab work or other tests done to discuss whether your values are considered within normal limits. The following are  normal ranges for each part of a CMP: Glucose  Cord: 45-96 mg/dL or 2.5-5.3 mmol/L (SI units).  Premature infant: 20-60 mg/dL or 1.1-3.3 mmol/L.  Neonate: 30-60 mg/dL or 1.7-3.3 mmol/L.  Infant: 40-90 mg/dL or 2.2-5.0 mmol/L.  Child under 47 years old: 60-100 mg/dL or 3.3-5.5 mmol/L.  Adult or child over 68 years old: ? Fasting: 70-110 mg/dL or less than 6.1 mmol/L. ? Random (nonfasting or casual): less than or equal to 200 mg/dL or less than 11.1 mmol/L.  Elderly: increase in normal range after age 27 years. Creatinine  Child under 3 years old: 0.1-0.4 mg/dL.  Child 2-24 years old: 0.2-0.5 mg/dL.  Child 11-3 years old: 0.3-0.6 mg/dL.  Child or adolescent 99-49 years old: 0.4-1.0 mg/dL.  Adult 79-41 years old: ? Female: 0.5-1.0 mg/dL. ? Female: 0.6-1.2 mg/dL.  Adult 46-47 years old: ? Female: 0.5-1.1  mg/dL. ? Female: 0.6-1.3 mg/dL.  Adult 54 years old and above: ? Female: 0.5-1.2 mg/dL. ? Female: 0.7-1.3 mg/dL. BUN  Cord: 21-40 mg/dL.  Newborn: 3-12 mg/dL.  Infant: 5-18 mg/dL.  Child: 5-18 mg/dL.  Adult: 10-20 mg/dL or 3.6-7.1 mmol/L (SI units).  Elderly: may be slightly higher than adult. Potassium  Newborn: 3.9-5.9 mEq/L.  Infant: 4.1-5.3 mEq/L.  Child: 3.4-4.7 mEq/L.  Adult or elderly: 3.5-5.0 mEq/L or 3.5-5.0 mmol/L (SI units). Sodium  Newborn: 134-144 mEq/L.  Infant: 134-150 mEq/L.  Child: 136-145 mEq/L.  Adult or elderly: 136-145 mEq/L or 136-145 mmol/L (SI units). Chloride  Premature infant: 95-110 mEq/L.  Newborn: 96-106 mEq/L.  Child: 90-110 mEq/L.  Adult or elderly: 98-106 mEq/L or 98-106 mmol/L (SI units). Calcium  Total calcium: ? Newborn under 49 days old: 7.6-10.4 mg/dL or 1.9-2.60 mmol/L. ? Umbilical: 6.2-95.2 mg/dL or 2.25-2.88 mmol/L. ? 4 days to 55 years old: 9.0-10.6 mg/dL or 2.3-2.65 mmol/L. ? Child: 8.8-10.8 mg/dL or 2.2-2.7 mmol/L. ? Adult: 9.0-10.5 mg/dL or 2.25-2.62 mmol/L.  Ionized calcium: ? Newborn: 4.20-5.58  mg/dL or 1.05-1.37 mmol/L. ? 2 months to 55 years old: 4.80-5.52 mg/dL or 1.20-1.38 mmol/L. ? Adult: 4.5-5.6 mg/dL or 1.05-1.30 mmol/L. Bicarbonate  Newborn: 13-22 mEq/L.  Infant: 20-28 mEq/L.  Child: 20-28 mEq/L.  Adult or elderly: 23-30 mEq/L or 23-30 mmol/L (SI units). ALP  Child under 3 years old: 85-235 units/L.  18-99 years old: 65-210 units/L.  80-24 years old: 60-300 units/L.  68-43 years old: 30-200 units/L.  Adult: 30-120 units/L or 0.5-2.0 microkatal/L (SI units).  Elderly: slightly higher than adult. ALT  Infant: may be twice as high as adult values.  Child or adult: 4-36 international units/L at 98.71F (37C) or 4-36 units/L (SI units).  Elderly: may be slightly higher than adult values. AST  Newborn 48-39 days old: 35-140 units/L.  Child under 1 years old: 15-60 units/L.  65-41 years old: 15-50 units/L.  39-86 years old: 10-50 units/L.  26-9 years old: 10-40 units/L.  Adult: 0-35 units/L or 0-0.58 microkatal/L (SI units).  Elderly: slightly higher than adults. Bilirubin  Total bilirubin for newborn: 1.0-12.0 mg/dL or 17.1-205 micromoles/L (SI units).  Adult, elderly, or child: ? Total bilirubin: 0.3-1.0 mg/dL or 5.1-17 micromoles/L. ? Indirect bilirubin: 0.2-0.8 mg/dL or 3.4-12.0 micromoles/L. ? Direct bilirubin: 0.1-0.3 mg/dL or 1.7-5.1 micromoles/L. Albumin  Premature infant: 3.0-4.2 g/dL.  Newborn: 3.5-5.4 g/dL.  Infant: 4.4-5.4 g/dL.  Child: 4.0-5.9 g/dL.  Adult or elderly: 3.5-5.0 g/dL or 35-50 g/L (SI units). Total protein  Premature infant: 4.2-7.6 g/dL.  Newborn: 4.6-7.4 g/dL.  Infant: 6.0-6.7 g/dL.  Child: 6.2-8.0 g/dL.  Adult or elderly: 6.4-8.3 g/dL or 64-83 g/L (SI units). MEANING OF RESULTS OUTSIDE OF NORMAL VALUE RANGES Diet and levels of activity can have an effect on your test results. Sometimes they can be the cause of values that are outside of normal limits. However, sometimes values outside normal limits can  indicate a medical disorder: Glucose Abnormally high glucose levels (hyperglycemia) are usually associated with prediabetes mellitus and diabetes mellitus. They can also occur with severe stress on the body. This stress can come from surgery or events such as stroke or trauma. Overactive thyroid gland and pancreatitis or pancreatic cancer can also cause abnormally high glucose levels. Abnormally low glucose levels (hypoglycemia) can occur with underactive thyroid gland and rare insulin-secreting tumors (insulinoma). Creatinine Abnormally high creatinine levels are most commonly seen in kidney failure. They can also be seen with overactive thyroid (hyperthyroidism), conditions related to overgrowth of the body (  acromegaly or gigantism), abnormal breakdown of muscle tissue (rhabdomyolysis), and early muscular dystrophy. Abnormally low creatinine levels can indicate low muscle mass associated with malnutrition or late-stage muscular dystrophy. BUN Abnormally high BUN levels, especially greater than 50 mg/dL, generally mean that your kidneys are not functioning normally. Abnormally low BUN levels can be seen with malnutrition and liver failure. Potassium Abnormally high potassium levels (hyperkalemia) are most often seen with kidney disease, massive destruction of red blood cells (hemolysis), and adrenal gland failure (Addison disease). Abnormally low potassium levels (hypokalemia) are seen with excessive levels of the hormone aldosterone (hyperaldosteronism). Sodium Abnormally high sodium levels (hypernatremia) can be seen with dehydration, excessive thirst, and urination due to abnormally low levels of antidiuretic hormone (diabetes insipidus). They can also be seen with hyperaldosteronism and excessive levels of cortisol in the body (Cushing syndrome). Abnormally low levels of sodium (hyponatremia) can be seen with congestive heart failure, cirrhosis of the liver, kidney failure, and the syndrome of  inappropriate antidiuretic hormone (SIADH). Chloride Abnormally high levels of chloride (hyperchloremia) can be seen with acute kidney failure, diabetes insipidus, prolonged diarrhea, and poisoning with aspirin or bromide. Abnormally low levels of chloride (hypochloremia) can be seen with prolonged vomiting, acute adrenal gland failure (addisonian crisis), hyperaldosteronism, and SIADH. Calcium Abnormally high levels of calcium (hypercalcemia) can occur with excessive activity of the parathyroid glands (hyperparathyroidism), certain cancers, and a type of inflammation seen in sarcoidosis and tuberculosis. Abnormally low levels of calcium (hypocalcemia) can be seen with underactive parathyroid glands (hypoparathyroidism), vitamin D deficiency, and acute pancreatitis. Bicarbonate Abnormally high bicarbonate levels are seen after prolonged vomiting and diuretic therapy, which lead to a decrease in the amount of acid in the body (metabolic alkalosis). They can also be seen in conditions that increase the amount of bicarbonate in the body. These conditions include hyperaldosteronism and rare hereditary disorders that interfere with how your kidneys handle electrolytes, such as Bartter syndrome. Abnormally low bicarbonate levels are seen with conditions that cause your body to produce too much acid (metabolic acidosis). These conditions include uncontrolled diabetes mellitus and poisoning with aspirin, methanol, or antifreeze (ethylene glycol). ALP An abnormally high level of ALP can be a sign of certain cancers or tumors, liver disease, hepatitis, sarcoidosis, rickets, a blocked bile duct, or bone problems such as a fracture. An abnormally low level of ALP can be caused by malnutrition, hypophosphatasia, or Wilson disease. ALT An abnormally high level of ALT can indicate mononucleosis, pancreatitis, or liver problems. The liver problems include hepatitis, cirrhosis, and liver cancer. AST An abnormally high  level of AST can indicate some of the same liver problems as a high level of ALT. It is also related to conditions such as mononucleosis, pancreatitis, and muscle trauma. Bilirubin An abnormally high level of bilirubin can result from liver problems such as cirrhosis, liver disease, and hepatitis. It can also result from problems with the bile ducts, pancreas, or gallbladder. Albumin An abnormally high level of albumin might be a sign of dehydration or of eating a high-protein diet. You can have a low level of albumin if you eat a low-protein diet or have had weight-loss surgery. An abnormally low level of albumin can also be a sign of a more serious health issue, including liver disease, kidney disease, or Crohn disease. Total protein An abnormally high total protein level often indicates an infection (such as hepatitis B or C or HIV), multiple myeloma, or Waldenstrom disease. An abnormally low level of total protein is seen in such conditions  as malnutrition, severe burns, heavy bleeding, and liver disease. Talk with your health care provider to discuss your results, treatment options, and if necessary, the need for more tests. Talk with your health care provider if you have any questions about your results. This information is not intended to replace advice given to you by your health care provider. Make sure you discuss any questions you have with your health care provider. Document Released: 02/06/2004 Document Revised: 09/18/2015 Document Reviewed: 05/11/2013 Elsevier Interactive Patient Education  [employer].

## 2017-09-05 LAB — COMPREHENSIVE METABOLIC PANEL
A/G RATIO: 1.3 (ref 1.2–2.2)
ALT: 15 IU/L (ref 0–32)
AST: 21 IU/L (ref 0–40)
Albumin: 3.9 g/dL (ref 3.5–5.5)
Alkaline Phosphatase: 69 IU/L (ref 39–117)
BUN/Creatinine Ratio: 11 (ref 9–23)
BUN: 11 mg/dL (ref 6–24)
Bilirubin Total: 0.2 mg/dL (ref 0.0–1.2)
CALCIUM: 9.5 mg/dL (ref 8.7–10.2)
CO2: 26 mmol/L (ref 20–29)
CREATININE: 1.04 mg/dL — AB (ref 0.57–1.00)
Chloride: 101 mmol/L (ref 96–106)
GFR calc Af Amer: 70 mL/min/{1.73_m2} (ref >59)
GFR, EST NON AFRICAN AMERICAN: 61 mL/min/{1.73_m2} (ref >59)
Globulin, Total: 2.9 g/dL (ref 1.5–4.5)
Glucose: 119 mg/dL — ABNORMAL HIGH (ref 65–99)
POTASSIUM: 4.2 mmol/L (ref 3.5–5.2)
Sodium: 141 mmol/L (ref 134–144)
TOTAL PROTEIN: 6.8 g/dL (ref 6.0–8.5)

## 2017-09-05 LAB — TSH: TSH: 0.737 u[IU]/mL (ref 0.450–4.500)

## 2017-09-10 ENCOUNTER — Ambulatory Visit (HOSPITAL_BASED_OUTPATIENT_CLINIC_OR_DEPARTMENT_OTHER): Payer: 59

## 2017-09-14 ENCOUNTER — Ambulatory Visit (HOSPITAL_BASED_OUTPATIENT_CLINIC_OR_DEPARTMENT_OTHER)
Admission: RE | Admit: 2017-09-14 | Discharge: 2017-09-14 | Disposition: A | Discharge status: Home / Self Care | Payer: 59 | Source: Ambulatory Visit | Attending: Cardiology | Admitting: Cardiology

## 2017-09-14 DIAGNOSIS — I340 Nonrheumatic mitral (valve) insufficiency: Secondary | ICD-10-CM | POA: Diagnosis not present

## 2017-09-14 DIAGNOSIS — I493 Ventricular premature depolarization: Secondary | ICD-10-CM

## 2017-09-14 DIAGNOSIS — I341 Nonrheumatic mitral (valve) prolapse: Secondary | ICD-10-CM | POA: Insufficient documentation

## 2017-09-14 NOTE — Progress Notes (Signed)
  Echocardiogram 2D Echocardiogram has been performed.  Bookert Guzzi T Dominika Losey 09/14/2017, 11:47 AM

## 2017-10-19 ENCOUNTER — Other Ambulatory Visit: Payer: Self-pay | Admitting: Family Medicine

## 2017-10-19 MED ORDER — VALACYCLOVIR HCL 1 G PO TABS: 1000.0000 mg | ORAL_TABLET | Freq: Three times a day (TID) | ORAL | 5 refills | Status: DC

## 2017-10-19 NOTE — Telephone Encounter (Signed)
Valcyclovir refill Last Refill:12/26/15 # 30 Last OV: 07/25/16 PCP: Dr Zola [facility]Lowne-Chase [facility] [place] [place]

## 2017-10-19 NOTE — Telephone Encounter (Signed)
Rx sent 

## 2017-10-19 NOTE — Telephone Encounter (Signed)
Copied from CRM [id number]. Topic: Quick Communication - Rx Refill/Question >> Oct 19, 2017 12:26 PM Mickel BaasMcGee, Anjelika Ausburn B, NT wrote: Medication: valACYclovir (VALTREX) 1000 MG tablet  Has the patient contacted their pharmacy? No. Med has not been filled since 2017 (Agent: If no, request that the patient contact the pharmacy for the refill.) (Agent: If yes, when and what did the pharmacy advise?)  Preferred Pharmacy (with phone number or street name): [id number] - [place], [place] - [address]  Agent: Please be advised that RX refills may take up to 3 business days. We ask that you follow-up with your pharmacy.

## 2017-10-28 ENCOUNTER — Telehealth: Payer: Self-pay | Admitting: *Deleted

## 2017-10-28 NOTE — Telephone Encounter (Signed)
Received Dermatopathology Report results from [facility]; forwarded to provider/SLS 10/02     

## 2017-12-14 ENCOUNTER — Other Ambulatory Visit: Payer: Self-pay | Admitting: Obstetrics and Gynecology

## 2017-12-14 DIAGNOSIS — Z803 Family history of malignant neoplasm of breast: Secondary | ICD-10-CM

## 2017-12-28 ENCOUNTER — Encounter: Payer: 59 | Admitting: Obstetrics & Gynecology

## 2018-01-27 HISTORY — PX: BREAST BIOPSY: SHX20

## 2018-03-11 ENCOUNTER — Other Ambulatory Visit: Payer: Self-pay | Admitting: Obstetrics and Gynecology

## 2018-03-11 DIAGNOSIS — Z803 Family history of malignant neoplasm of breast: Secondary | ICD-10-CM

## 2018-03-22 ENCOUNTER — Ambulatory Visit
Admission: RE | Admit: 2018-03-22 | Discharge: 2018-03-22 | Disposition: A | Discharge status: Home / Self Care | Payer: 59 | Source: Ambulatory Visit | Attending: Obstetrics and Gynecology | Admitting: Obstetrics and Gynecology

## 2018-03-22 DIAGNOSIS — Z803 Family history of malignant neoplasm of breast: Secondary | ICD-10-CM

## 2018-03-22 MED ORDER — GADOBUTROL 1 MMOL/ML IV SOLN
7.0000 mL | Freq: Once | INTRAVENOUS | Status: AC | PRN
Start: 2018-03-22 — End: 2018-03-22
  Administered 2018-03-22: 7 mL via INTRAVENOUS

## 2018-03-24 ENCOUNTER — Other Ambulatory Visit: Payer: Self-pay | Admitting: Obstetrics and Gynecology

## 2018-03-24 DIAGNOSIS — R9389 Abnormal findings on diagnostic imaging of other specified body structures: Secondary | ICD-10-CM

## 2018-03-31 ENCOUNTER — Ambulatory Visit
Admission: RE | Admit: 2018-03-31 | Discharge: 2018-03-31 | Disposition: A | Discharge status: Home / Self Care | Payer: 59 | Source: Ambulatory Visit | Attending: Obstetrics and Gynecology | Admitting: Obstetrics and Gynecology

## 2018-03-31 DIAGNOSIS — R9389 Abnormal findings on diagnostic imaging of other specified body structures: Secondary | ICD-10-CM

## 2018-03-31 MED ORDER — GADOBUTROL 1 MMOL/ML IV SOLN
6.0000 mL | Freq: Once | INTRAVENOUS | Status: AC | PRN
  Administered 2018-03-31: 6 mL via INTRAVENOUS

## 2018-04-05 ENCOUNTER — Encounter: Payer: Self-pay | Admitting: Family Medicine

## 2018-04-06 ENCOUNTER — Telehealth: Payer: Self-pay | Admitting: *Deleted

## 2018-04-06 NOTE — Telephone Encounter (Signed)
Received Dermatopathology Report results from [facility]; forwarded to provider/SLS 03/10     

## 2018-04-09 ENCOUNTER — Telehealth: Payer: Self-pay | Admitting: *Deleted

## 2018-04-09 NOTE — Telephone Encounter (Signed)
Received Endoscopy Results from [facility]; forwarded to provider/SLS 03/13

## 2018-12-29 ENCOUNTER — Other Ambulatory Visit: Payer: Self-pay | Admitting: Obstetrics and Gynecology

## 2018-12-29 DIAGNOSIS — Z9189 Other specified personal risk factors, not elsewhere classified: Secondary | ICD-10-CM

## 2019-01-10 ENCOUNTER — Other Ambulatory Visit: Payer: 59

## 2019-03-15 ENCOUNTER — Other Ambulatory Visit: Payer: Self-pay

## 2019-03-15 ENCOUNTER — Ambulatory Visit
Admission: RE | Admit: 2019-03-15 | Discharge: 2019-03-15 | Disposition: A | Discharge status: Home / Self Care | Payer: Managed Care, Other (non HMO) | Source: Ambulatory Visit | Attending: Obstetrics and Gynecology | Admitting: Obstetrics and Gynecology

## 2019-03-15 DIAGNOSIS — Z9189 Other specified personal risk factors, not elsewhere classified: Secondary | ICD-10-CM

## 2019-03-15 MED ORDER — GADOBUTROL 1 MMOL/ML IV SOLN
7.0000 mL | Freq: Once | INTRAVENOUS | Status: AC | PRN
  Administered 2019-03-15: 11:00:00 7 mL via INTRAVENOUS

## 2019-10-21 ENCOUNTER — Other Ambulatory Visit: Payer: Managed Care, Other (non HMO)

## 2019-10-21 DIAGNOSIS — Z20822 Contact with and (suspected) exposure to covid-19: Secondary | ICD-10-CM

## 2019-10-23 LAB — NOVEL CORONAVIRUS, NAA: SARS-CoV-2, NAA: NOT DETECTED

## 2019-10-23 LAB — SARS-COV-2, NAA 2 DAY TAT

## 2019-11-23 ENCOUNTER — Telehealth: Payer: Self-pay | Admitting: Family Medicine

## 2019-11-23 NOTE — Telephone Encounter (Signed)
Fine with me. Looks like she is not seen very often. Just as a heads up to patient we are very preventive are driven so will need to see her at least yearly for checkups, etc, more frequently if we are treating/managing chronic conditions.

## 2019-11-23 NOTE — Telephone Encounter (Signed)
Pt has been scheduled.  °

## 2019-11-23 NOTE — Telephone Encounter (Signed)
Fine with me

## 2019-11-23 NOTE — Telephone Encounter (Signed)
Pt would like to TOC to the [place] office due to it being closer to her, please advise if TOC is ok.

## 2019-12-05 ENCOUNTER — Encounter: Payer: Self-pay | Admitting: Physician Assistant

## 2019-12-05 ENCOUNTER — Other Ambulatory Visit: Payer: Self-pay

## 2019-12-05 ENCOUNTER — Ambulatory Visit (INDEPENDENT_AMBULATORY_CARE_PROVIDER_SITE_OTHER): Payer: No Typology Code available for payment source | Admitting: Physician Assistant

## 2019-12-05 VITALS — BP 100/60 | HR 62 | Temp 98.4°F | Resp 16 | Ht 68.0 in | Wt 165.0 lb

## 2019-12-05 DIAGNOSIS — Z23 Encounter for immunization: Secondary | ICD-10-CM

## 2019-12-05 DIAGNOSIS — M19042 Primary osteoarthritis, left hand: Secondary | ICD-10-CM | POA: Diagnosis not present

## 2019-12-05 DIAGNOSIS — Z0000 Encounter for general adult medical examination without abnormal findings: Secondary | ICD-10-CM | POA: Diagnosis not present

## 2019-12-05 DIAGNOSIS — M19041 Primary osteoarthritis, right hand: Secondary | ICD-10-CM | POA: Diagnosis not present

## 2019-12-05 LAB — CBC WITH DIFFERENTIAL/PLATELET
Basophils Absolute: 0.1 10*3/uL (ref 0.0–0.1)
Basophils Relative: 1.1 % (ref 0.0–3.0)
Eosinophils Absolute: 0.2 10*3/uL (ref 0.0–0.7)
Eosinophils Relative: 3.8 % (ref 0.0–5.0)
HCT: 41.8 % (ref 36.0–46.0)
Hemoglobin: 14.1 g/dL (ref 12.0–15.0)
Lymphocytes Relative: 36.2 % (ref 12.0–46.0)
Lymphs Abs: 1.9 10*3/uL (ref 0.7–4.0)
MCHC: 33.6 g/dL (ref 30.0–36.0)
MCV: 90.2 fl (ref 78.0–100.0)
Monocytes Absolute: 0.5 10*3/uL (ref 0.1–1.0)
Monocytes Relative: 9.3 % (ref 3.0–12.0)
Neutro Abs: 2.6 10*3/uL (ref 1.4–7.7)
Neutrophils Relative %: 49.6 % (ref 43.0–77.0)
Platelets: 249 10*3/uL (ref 150.0–400.0)
RBC: 4.64 Mil/uL (ref 3.87–5.11)
RDW: 13.3 % (ref 11.5–15.5)
WBC: 5.2 10*3/uL (ref 4.0–10.5)

## 2019-12-05 LAB — COMPREHENSIVE METABOLIC PANEL
ALT: 11 U/L (ref 0–35)
AST: 19 U/L (ref 0–37)
Albumin: 4.4 g/dL (ref 3.5–5.2)
Alkaline Phosphatase: 112 U/L (ref 39–117)
BUN: 17 mg/dL (ref 6–23)
CO2: 31 mEq/L (ref 19–32)
Calcium: 9.5 mg/dL (ref 8.4–10.5)
Chloride: 102 mEq/L (ref 96–112)
Creatinine, Ser: 0.88 mg/dL (ref 0.40–1.20)
GFR: 73.11 mL/min (ref >60.00)
Glucose, Bld: 84 mg/dL (ref 70–99)
Potassium: 4.8 mEq/L (ref 3.5–5.1)
Sodium: 140 mEq/L (ref 135–145)
Total Bilirubin: 0.6 mg/dL (ref 0.2–1.2)
Total Protein: 6.8 g/dL (ref 6.0–8.3)

## 2019-12-05 LAB — LIPID PANEL
Cholesterol: 235 mg/dL — ABNORMAL HIGH (ref 0–200)
HDL: 60.3 mg/dL (ref >39.00)
LDL Cholesterol: 146 mg/dL — ABNORMAL HIGH (ref 0–99)
NonHDL: 175.14
Total CHOL/HDL Ratio: 4
Triglycerides: 147 mg/dL (ref 0.0–149.0)
VLDL: 29.4 mg/dL (ref 0.0–40.0)

## 2019-12-05 LAB — HEMOGLOBIN A1C: Hgb A1c MFr Bld: 5.7 % (ref 4.6–6.5)

## 2019-12-05 LAB — TSH: TSH: 0.74 u[IU]/mL (ref 0.35–4.50)
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
## 2019-12-05 NOTE — Patient Instructions (Signed)
Please go to the lab for blood work.   Our office will call you with your results unless you have chosen to receive results via MyChart.  If your blood work is normal we will follow-up each year for physicals and as scheduled for chronic medical problems.  If anything is abnormal we will treat accordingly and get you in for a follow-up.  Sleep Hygiene  Do: (1) Go to bed at the same time each day. (2) Get up from bed at the same time each day. (3) Get regular exercise each day, preferably in the morning.  There is goof evidence that regular exercise improves restful sleep.  This includes stretching and aerobic exercise. (4) Get regular exposure to outdoor or bright lights, especially in the late afternoon. (5) Keep the temperature in your bedroom comfortable. (6) Keep the bedroom quiet when sleeping. (7) Keep the bedroom dark enough to facilitate sleep. (8) Use your bed only for sleep and sex. (9) Take medications as directed.  It is helpful to take prescribed sleeping pills 1 hour before bedtime, so they are causing drowsiness when you lie down, or 10 hours before getting up, to avoid daytime drowsiness. (10) Use a relaxation exercise just before going to sleep -- imagery, massage, warm bath. (11) Keep your feet and hands warm.  Wear warm socks and/or mittens or gloves to bed.  Don't: (1) Exercise just before going to bed. (2) Engage in stimulating activity just before bed, such as playing a competitive game, watching an exciting program on television, or having an important discussion with a loved one. (3) Have caffeine in the evening (coffee, teas, chocolate, sodas, etc.) (4) Read or watch television in bed. (5) Use alcohol to help you sleep. (6) Go to bed too hungry or too full. (7) Take another person's sleeping pills. (8) Take over-the-counter sleeping pills, without your doctor's knowledge.  Tolerance can develop rapidly with these medications.  Diphenhydramine can have serious  side effects for elderly patients. (9) Take daytime naps. (10) Command yourself to go to sleep.  This only makes your mind and body more alert.  If you lie awake for more than 20-30 minutes, get up, go to a different room, participate in a quiet activity (Ex - non-excitable reading or television), and then return to bed when you feel sleepy.  Do this as many times during the night as needed.  This may cause you to have a night or two of poor sleep but it will train your brain to know when it is time for sleep.  How to Perform the Epley Maneuver The Epley maneuver is an exercise that relieves symptoms of vertigo. Vertigo is the feeling that you or your surroundings are moving when they are not. When you feel vertigo, you may feel like the room is spinning and have trouble walking. Dizziness is a little different than vertigo. When you are dizzy, you may feel unsteady or light-headed. You can do this maneuver at home whenever you have symptoms of vertigo. You can do it up to 3 times a day until your symptoms go away. Even though the Epley maneuver may relieve your vertigo for a few weeks, it is possible that your symptoms will return. This maneuver relieves vertigo, but it does not relieve dizziness. What are the risks? If it is done correctly, the Epley maneuver is considered safe. Sometimes it can lead to dizziness or nausea that goes away after a short time. If you develop other symptoms, such as changes  in vision, weakness, or numbness, stop doing the maneuver and call your health care provider. How to perform the Epley maneuver 1. Sit on the edge of a bed or table with your back straight and your legs extended or hanging over the edge of the bed or table. 2. Turn your head halfway toward the affected ear or side. 3. Lie backward quickly with your head turned until you are lying flat on your back. You may want to position a pillow under your shoulders. 4. Hold this position for 30 seconds. You may  experience an attack of vertigo. This is normal. 5. Turn your head to the opposite direction until your unaffected ear is facing the floor. 6. Hold this position for 30 seconds. You may experience an attack of vertigo. This is normal. Hold this position until the vertigo stops. 7. Turn your whole body to the same side as your head. Hold for another 30 seconds. 8. Sit back up. You can repeat this exercise up to 3 times a day. Follow these instructions at home:  After doing the Epley maneuver, you can return to your normal activities.  Ask your health care provider if there is anything you should do at home to prevent vertigo. He or she may recommend that you: ? Keep your head raised (elevated) with two or more pillows while you sleep. ? Do not sleep on the side of your affected ear. ? Get up slowly from bed. ? Avoid sudden movements during the day. ? Avoid extreme head movement, like looking up or bending over. Contact a health care provider if:  Your vertigo gets worse.  You have other symptoms, including: ? Nausea. ? Vomiting. ? Headache. Get help right away if:  You have vision changes.  You have a severe or worsening headache or neck pain.  You cannot stop vomiting.  You have new numbness or weakness in any part of your body. Summary  Vertigo is the feeling that you or your surroundings are moving when they are not.  The Epley maneuver is an exercise that relieves symptoms of vertigo.  If the Epley maneuver is done correctly, it is considered safe. You can do it up to 3 times a day. This information is not intended to replace advice given to you by your health care provider. Make sure you discuss any questions you have with your health care provider. Document Revised: 12/26/2016 Document Reviewed: 12/04/2015 Elsevier Patient Education  [employer] .

## 2019-12-18 IMAGING — MR MR BREAST BIOPSY
7 of 10 series · 31 of 48 positions shown · IV contrast (6 ml gadavist)
Comparison: 03/22/2018
COMPARISON: 03/22/2018
COMPARISON: 03/22/2018

Addendum:
CLINICAL DATA: Enhancing mass in the retroareolar region of the
LEFT breast.

EXAM:
MRI GUIDED CORE NEEDLE BIOPSY OF THE LEFT BREAST
TECHNIQUE: Multiplanar, multisequence MR imaging of the LEFT breast was
performed both before and after administration of intravenous
contrast.
CONTRAST:  6 ml Gadavist

[Series 2: fiducial unilateral · sagittal · 2.0mm · 1.33mm/px · 3 of 52 slices shown]
[im 1/52]
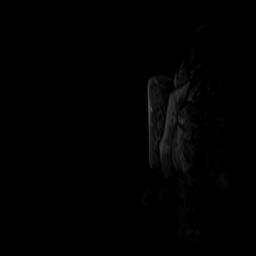
[im 26/52]
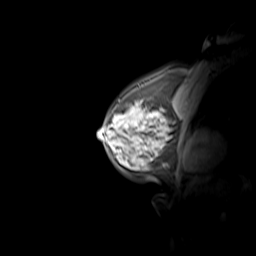
[im 52/52]
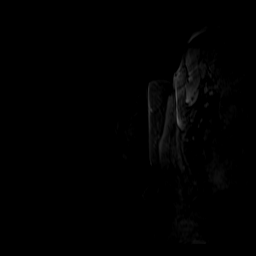

[Series 3: dynamic pre · axial · non-contrast · 1.3mm · 0.73mm/px · z∈[-62,+123]mm · 5 of 144 slices shown]
[im 1/144]
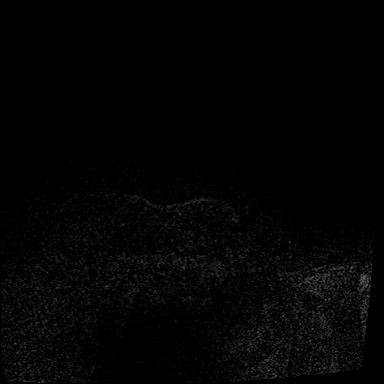
[im 36/144]
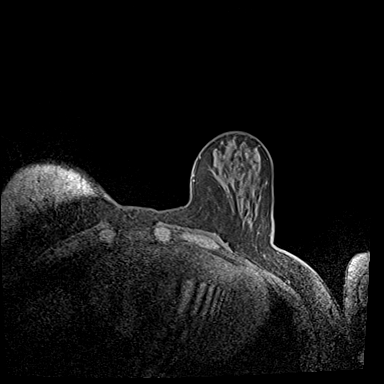
[im 72/144]
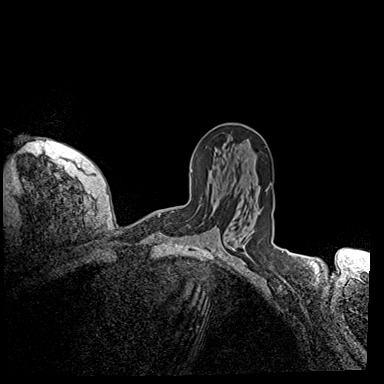
[im 108/144]
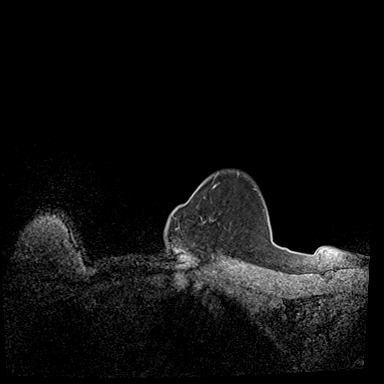
[im 144/144]
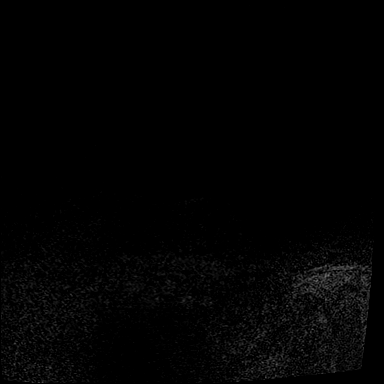

[Series 4: dynamic post 20 · axial · 1.3mm · 0.73mm/px · z∈[-62,+123]mm · 5 of 144 slices shown (1 of 2)]
[im 1/144]
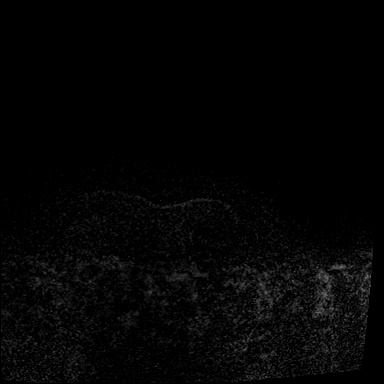
[im 36/144]
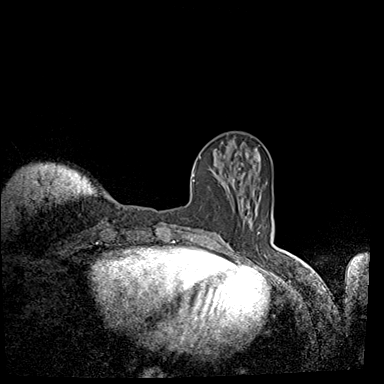
[im 72/144]
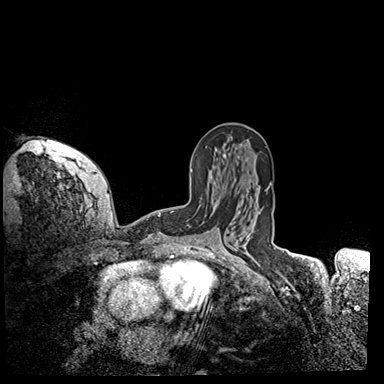
[im 108/144]
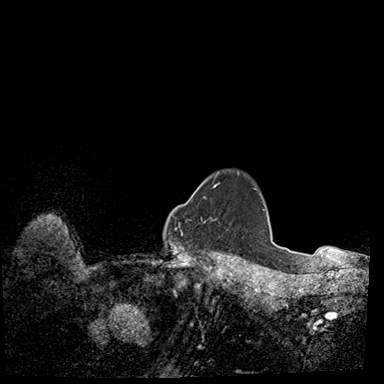
[im 144/144]
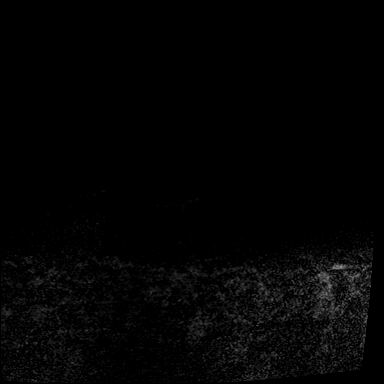

[Series 5: dynamic post 20 · axial · 1.3mm · 0.73mm/px · z∈[-62,+123]mm · 5 of 144 slices shown (2 of 2)]
[im 1/144]
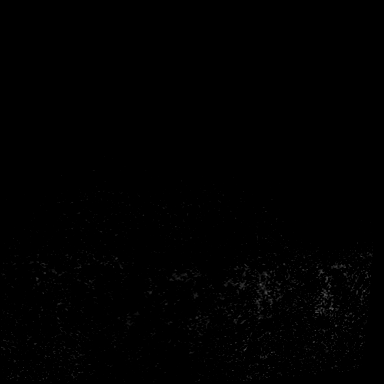
[im 36/144]
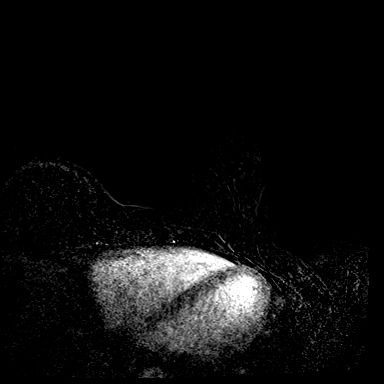
[im 72/144]
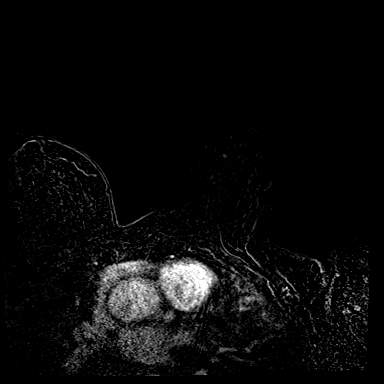
[im 108/144]
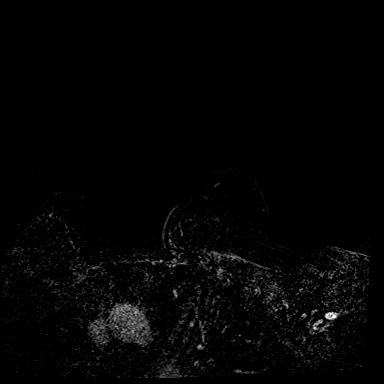
[im 144/144]
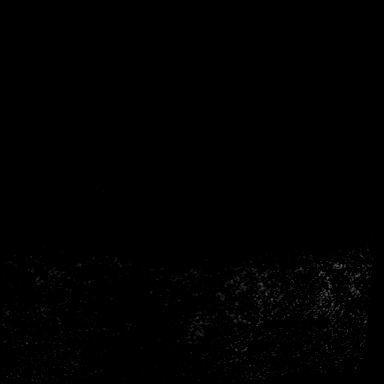

[Series 6: dynamic post 3 · axial · 1.3mm · 0.73mm/px · z∈[-62,+123]mm · 5 of 144 slices shown (1 of 2)]
[im 1/144]
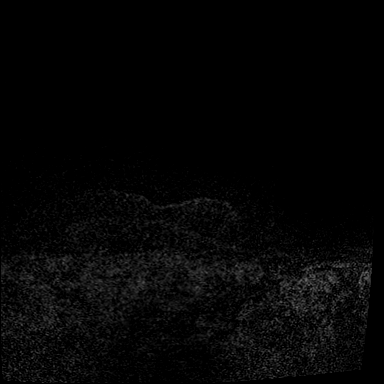
[im 36/144]
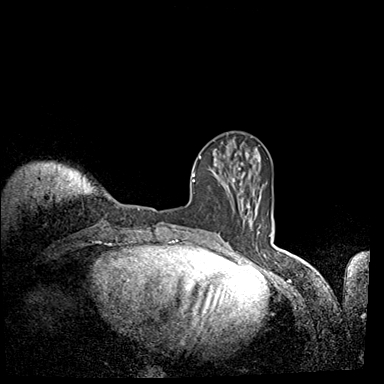
[im 72/144]
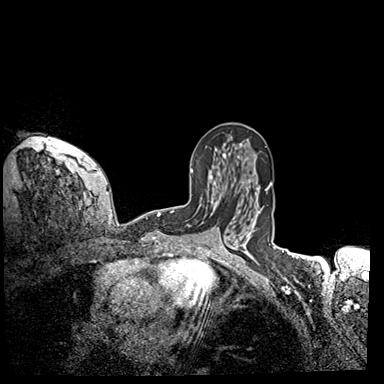
[im 108/144]
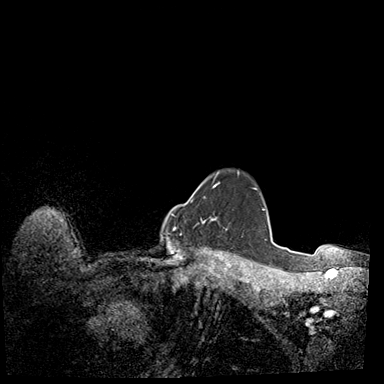
[im 144/144]
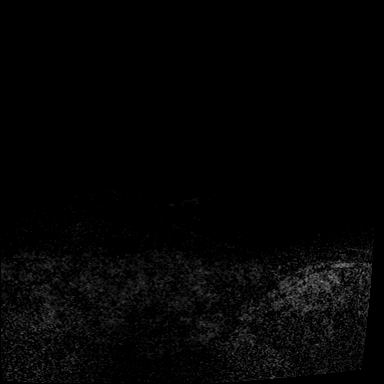

[Series 7: dynamic post 3 · axial · 1.3mm · 0.73mm/px · z∈[-62,+123]mm · 5 of 144 slices shown (2 of 2)]
[im 1/144]
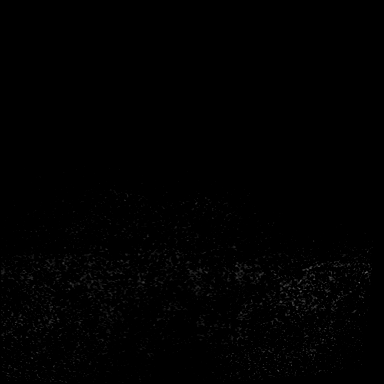
[im 36/144]
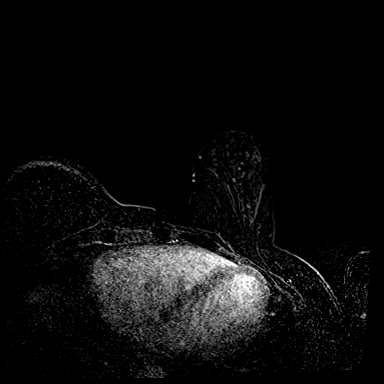
[im 72/144]
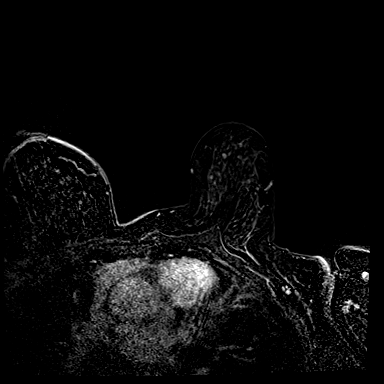
[im 108/144]
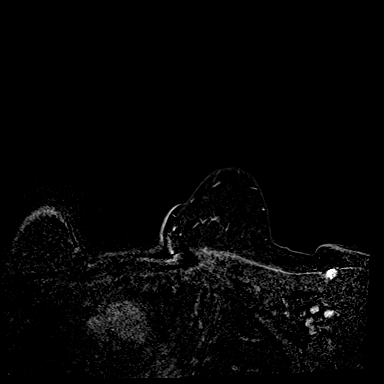
[im 144/144]
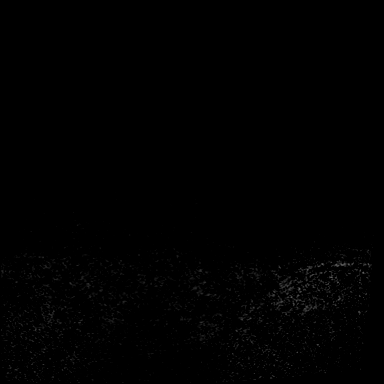

[Series 8: needle confirmation · axial · 1.3mm · 0.73mm/px · z∈[-62,+30]mm · 3 of 144 slices shown]
[im 1/144]
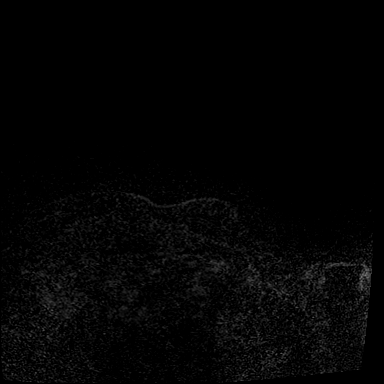
[im 36/144]
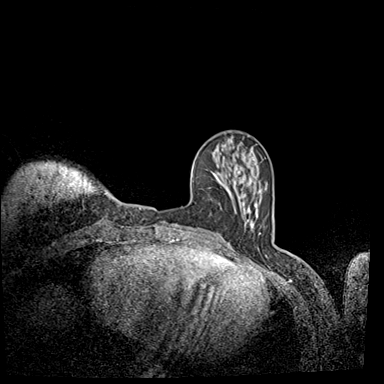
[im 72/144]
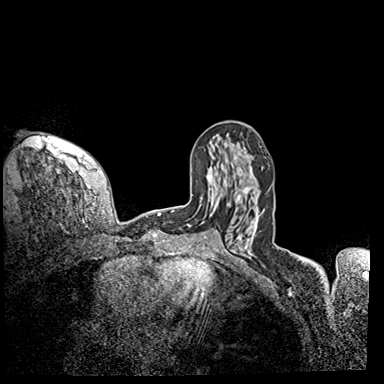

[31 of 48 positions shown; findings below may reference images not displayed]

FINDINGS: I met with the patient, and we discussed the procedure of MRI guided
biopsy, including risks, benefits, and alternatives. Specifically,
we discussed the risks of infection, bleeding, tissue injury, clip
migration, and inadequate sampling. Informed, written consent was
given. The usual time out protocol was performed immediately prior
to the procedure.

Using sterile technique, 1% Lidocaine, MRI guidance, and a 9 gauge
vacuum assisted device, biopsy was performed of enhancing mass in
the retroareolar region of the LEFT breast using a LATERAL to MEDIAL
approach. At the conclusion of the procedure, a barbell shaped
tissue marker clip was deployed into the biopsy cavity. Follow-up
2-view mammogram was performed and dictated separately.
IMPRESSION: MRI guided biopsy of LEFT breast mass.  No apparent complications.

ADDENDUM:
Pathology revealed FIBROCYSTIC CHANGES INCLUDING APOCRINE
METAPLASIA, DUCT ECTASIA AND PERIDUCTULAR CHRONIC INFLAMMATION,
FEATURES SUGGESTIVE OF CYST RUPTURE WITH DENSE HISTIOCYTIC
INFILTRATE of the Left breast, retroareolar, (barbell). This was
found to be concordant by Dr. Keyanna Xiong.

Pathology results were discussed with the patient by telephone. The
patient reported doing well after the biopsy with tenderness at the
site. Post biopsy instructions and care were reviewed and questions
were answered. The patient was encouraged to call The [REDACTED]

The patient was instructed to return for a bilateral breast MRI in 6
months.

Pathology results reported by Deivis Manuel Kro, RN on 04/01/2018.

*** End of Addendum ***
Addendum:
FINDINGS: I met with the patient, and we discussed the procedure of MRI guided
biopsy, including risks, benefits, and alternatives. Specifically,
we discussed the risks of infection, bleeding, tissue injury, clip
migration, and inadequate sampling. Informed, written consent was
given. The usual time out protocol was performed immediately prior
to the procedure.

Using sterile technique, 1% Lidocaine, MRI guidance, and a 9 gauge
vacuum assisted device, biopsy was performed of enhancing mass in
the retroareolar region of the LEFT breast using a LATERAL to MEDIAL
approach. At the conclusion of the procedure, a barbell shaped
tissue marker clip was deployed into the biopsy cavity. Follow-up
2-view mammogram was performed and dictated separately.
IMPRESSION: MRI guided biopsy of LEFT breast mass.  No apparent complications.

ADDENDUM:
Pathology revealed FIBROCYSTIC CHANGES INCLUDING APOCRINE
METAPLASIA, DUCT ECTASIA AND PERIDUCTULAR CHRONIC INFLAMMATION,
FEATURES SUGGESTIVE OF CYST RUPTURE WITH DENSE HISTIOCYTIC
INFILTRATE of the Left breast, retroareolar, (barbell). This was
found to be concordant by Dr. Keyanna Xiong.

Pathology results were discussed with the patient by telephone. The
patient reported doing well after the biopsy with tenderness at the
site. Post biopsy instructions and care were reviewed and questions
were answered. The patient was encouraged to call The [REDACTED]

The patient was instructed to return for a bilateral breast MRI in 6
months.

Pathology results reported by Deivis Manuel Kro, RN on 04/01/2018.

*** End of Addendum ***
FINDINGS: I met with the patient, and we discussed the procedure of MRI guided
biopsy, including risks, benefits, and alternatives. Specifically,
we discussed the risks of infection, bleeding, tissue injury, clip
migration, and inadequate sampling. Informed, written consent was
given. The usual time out protocol was performed immediately prior
to the procedure.

Using sterile technique, 1% Lidocaine, MRI guidance, and a 9 gauge
vacuum assisted device, biopsy was performed of enhancing mass in
the retroareolar region of the LEFT breast using a LATERAL to MEDIAL
approach. At the conclusion of the procedure, a barbell shaped
tissue marker clip was deployed into the biopsy cavity. Follow-up
2-view mammogram was performed and dictated separately.
IMPRESSION: MRI guided biopsy of LEFT breast mass.  No apparent complications.

## 2020-01-02 ENCOUNTER — Telehealth: Payer: Self-pay | Admitting: Cardiology

## 2020-01-02 NOTE — Telephone Encounter (Signed)
I agree

## 2020-01-02 NOTE — Telephone Encounter (Signed)
Laura [name], Dr. Lindaann Slough next available new pt slot is not until after April 2022. If pt wants a sooner new pt appt, she should request to switch with a new Provider, like Dr. Shari Prows.

## 2020-01-02 NOTE — Telephone Encounter (Signed)
Sorry patient said would like to transition from Dr. Dulce Sellar to Dr. Delton See.  Patient would like to transition from Dr. Dulce Sellar to Dr. Shari Prows due to her moving to the [facility] location. Please confirm

## 2020-01-02 NOTE — Telephone Encounter (Signed)
Patient would like to transition from Dr. Dulce Sellar to Dr. Shari Prows due to her moving to the [facility] location. Please confirm

## 2020-02-03 ENCOUNTER — Other Ambulatory Visit: Payer: Self-pay | Admitting: Obstetrics and Gynecology

## 2020-02-07 ENCOUNTER — Other Ambulatory Visit: Payer: Self-pay | Admitting: Obstetrics and Gynecology

## 2020-02-07 DIAGNOSIS — Z9189 Other specified personal risk factors, not elsewhere classified: Secondary | ICD-10-CM

## 2020-02-29 ENCOUNTER — Other Ambulatory Visit: Payer: Self-pay | Admitting: Family Medicine

## 2020-03-01 NOTE — Telephone Encounter (Signed)
Okay to refuse? Pt has not been seen since 2019.

## 2020-03-02 ENCOUNTER — Other Ambulatory Visit: Payer: No Typology Code available for payment source

## 2020-03-27 ENCOUNTER — Other Ambulatory Visit: Payer: Self-pay

## 2020-03-27 ENCOUNTER — Ambulatory Visit
Admission: RE | Admit: 2020-03-27 | Discharge: 2020-03-27 | Disposition: A | Discharge status: Home / Self Care | Payer: No Typology Code available for payment source | Source: Ambulatory Visit | Attending: Obstetrics and Gynecology | Admitting: Obstetrics and Gynecology

## 2020-03-27 DIAGNOSIS — Z9189 Other specified personal risk factors, not elsewhere classified: Secondary | ICD-10-CM

## 2020-03-27 MED ORDER — GADOBUTROL 1 MMOL/ML IV SOLN
7.0000 mL | Freq: Once | INTRAVENOUS | Status: AC | PRN
  Administered 2020-03-27: 7 mL via INTRAVENOUS

## 2020-07-31 ENCOUNTER — Telehealth: Payer: Self-pay

## 2020-07-31 NOTE — Telephone Encounter (Signed)
Error

## 2020-10-15 ENCOUNTER — Telehealth: Payer: No Typology Code available for payment source | Admitting: Family Medicine

## 2020-10-15 ENCOUNTER — Encounter: Payer: Self-pay | Admitting: Nurse Practitioner

## 2020-10-15 ENCOUNTER — Telehealth (INDEPENDENT_AMBULATORY_CARE_PROVIDER_SITE_OTHER): Payer: No Typology Code available for payment source | Admitting: Nurse Practitioner

## 2020-10-15 VITALS — Temp 97.9°F | Wt 162.0 lb

## 2020-10-15 DIAGNOSIS — J3489 Other specified disorders of nose and nasal sinuses: Secondary | ICD-10-CM | POA: Diagnosis not present

## 2020-10-15 MED ORDER — AMOXICILLIN 500 MG PO CAPS: 500.0000 mg | ORAL_CAPSULE | Freq: Three times a day (TID) | ORAL | 0 refills | Status: AC

## 2020-10-15 NOTE — Assessment & Plan Note (Signed)
Patient with history of sinus infections.  Symptoms been gone for a week.  Patient has been attempting over-the-counter regimens without great relief.  Given length of symptoms and patient's history will elect to go ahead and treat.  Start amoxicillin 500 mg 3 times daily x7 days.  Patient may continue using over-the-counter Advil if beneficial also may continue using over-the-counter Claritin if beneficial for patient.

## 2020-10-15 NOTE — Progress Notes (Signed)
Patient ID: Laura [name], female    DOB: 04-01-1960, 60 y.o.   MRN: [id number]  Virtual visit completed through [employer], a video enabled telemedicine application. Due to national recommendations of social distancing due to COVID-19, a virtual visit is felt to be most appropriate for this patient at this time. Reviewed limitations, risks, security and privacy concerns of performing a virtual visit and the availability of in person appointments. I also reviewed that there may be a patient responsible charge related to this service. The patient agreed to proceed.   Patient location: home Provider location: [place] at [facility], office Persons participating in this virtual visit: patient, provider   If any vitals were documented, they were collected by patient at home unless specified below.    Temp 97.9 F (36.6 C)   Wt 162 lb (73.5 kg)   BMI 24.63 kg/m    CC: Sinus issues Subjective:   HPI: Laura [name] is a 60 y.o. female presenting on 10/15/2020 for Sinus Problem (Started about 1 week ago. Sinus pressure. Headaches, throat irritation sensation, more joint pain, post nasal drip. Covid test on 10/12/20 was negative. OTC Zyrtec and Advil tried. Not able to take decongestants. Burning sensation with breathing in the nasal passageway present.)    At home Covid test on 10/12/2020 that was negative. History of sinus infections. States this is similar. Symptoms have been present approx 1 week and have not improved.  Has been taking Advil and liquid Claritin. Advil with some benefit, Claritin without benefit.      Relevant past medical, surgical, family and social history reviewed and updated as indicated. Interim medical history since our last visit reviewed. Allergies and medications reviewed and updated. Outpatient Medications Prior to Visit  Medication Sig Dispense Refill   Ascorbic Acid (VITAMIN C WITH ROSE HIPS) 500 MG tablet Take 500 mg by mouth daily.     B Complex  Vitamins (VITAMIN B COMPLEX PO) Take by mouth.     Cholecalciferol (VITAMIN D3) 50 MCG (2000 UT) TABS Take 1 tablet by mouth daily.     clobetasol (TEMOVATE) 0.05 % external solution clobetasol 0.05 % scalp solution     glucosamine-chondroitin 500-400 MG tablet Take 1 tablet by mouth daily.       Multiple Vitamin (MULTI-VITAMINS) TABS Take by mouth.     Omega-3 Fatty Acids (FISH OIL) 1200 MG CAPS Take 1 capsule by mouth daily.     Probiotic Product (PROBIOTIC FORMULA) CAPS Take 1 capsule by mouth daily. Cultrelle     TURMERIC CURCUMIN PO Take 700 mg by mouth daily.     No facility-administered medications prior to visit.     Per HPI unless specifically indicated in ROS section below Review of Systems  Constitutional:  Positive for fatigue. Negative for chills and fever.  HENT:  Positive for postnasal drip, sinus pressure and sore throat. Negative for ear discharge and ear pain.   Respiratory:  Negative for choking and shortness of breath.   Cardiovascular:  Positive for chest pain.  Gastrointestinal:  Positive for diarrhea. Negative for abdominal pain, nausea and vomiting.  Musculoskeletal:  Positive for arthralgias.  Neurological:  Positive for headaches. Negative for dizziness.  Objective:  Temp 97.9 F (36.6 C)   Wt 162 lb (73.5 kg)   BMI 24.63 kg/m   Wt Readings from Last 3 Encounters:  10/15/20 162 lb (73.5 kg)  12/05/19 165 lb (74.8 kg)  09/04/17 161 lb 8 oz (73.3 kg)  Physical exam: Gen: alert, NAD, not ill appearing Pulm: speaks in complete sentences without increased work of breathing Psych: normal mood, normal thought content      Results for orders placed or performed in visit on 12/05/19  CBC with Differential/Platelet  Result Value Ref Range   WBC 5.2 4.0 - 10.5 K/uL   RBC 4.64 3.87 - 5.11 Mil/uL   Hemoglobin 14.1 12.0 - 15.0 g/dL   HCT 16.0 73.7 - 10.6 %   MCV 90.2 78.0 - 100.0 fl   MCHC 33.6 30.0 - 36.0 g/dL   RDW 26.9 48.5 - 46.2 %   Platelets  249.0 150.0 - 400.0 K/uL   Neutrophils Relative % 49.6 43.0 - 77.0 %   Lymphocytes Relative 36.2 12.0 - 46.0 %   Monocytes Relative 9.3 3.0 - 12.0 %   Eosinophils Relative 3.8 0.0 - 5.0 %   Basophils Relative 1.1 0.0 - 3.0 %   Neutro Abs 2.6 1.4 - 7.7 K/uL   Lymphs Abs 1.9 0.7 - 4.0 K/uL   Monocytes Absolute 0.5 0.1 - 1.0 K/uL   Eosinophils Absolute 0.2 0.0 - 0.7 K/uL   Basophils Absolute 0.1 0.0 - 0.1 K/uL  Comprehensive metabolic panel  Result Value Ref Range   Sodium 140 135 - 145 mEq/L   Potassium 4.8 3.5 - 5.1 mEq/L   Chloride 102 96 - 112 mEq/L   CO2 31 19 - 32 mEq/L   Glucose, Bld 84 70 - 99 mg/dL   BUN 17 6 - 23 mg/dL   Creatinine, Ser 7.03 0.40 - 1.20 mg/dL   Total Bilirubin 0.6 0.2 - 1.2 mg/dL   Alkaline Phosphatase 112 39 - 117 U/L   AST 19 0 - 37 U/L   ALT 11 0 - 35 U/L   Total Protein 6.8 6.0 - 8.3 g/dL   Albumin 4.4 3.5 - 5.2 g/dL   GFR 50.09 >38.18 mL/min   Calcium 9.5 8.4 - 10.5 mg/dL  Hemoglobin E9H  Result Value Ref Range   Hgb A1c MFr Bld 5.7 4.6 - 6.5 %  Lipid panel  Result Value Ref Range   Cholesterol 235 (H) 0 - 200 mg/dL   Triglycerides 371.6 0.0 - 149.0 mg/dL   HDL 96.78 >93.81 mg/dL   VLDL 01.7 0.0 - 51.0 mg/dL   LDL Cholesterol 258 (H) 0 - 99 mg/dL   Total CHOL/HDL Ratio 4    NonHDL 175.14   TSH  Result Value Ref Range   TSH 0.74 0.35 - 4.50 uIU/mL   Assessment & Plan:   Problem List Items Addressed This Visit       Other   Sinus pressure - Primary    Patient with history of sinus infections.  Symptoms been gone for a week.  Patient has been attempting over-the-counter regimens without great relief.  Given length of symptoms and patient's history will elect to go ahead and treat.  Start amoxicillin 500 mg 3 times daily x7 days.  Patient may continue using over-the-counter Advil if beneficial also may continue using over-the-counter Claritin if beneficial for patient.      Relevant Medications   amoxicillin (AMOXIL) 500 MG capsule      No orders of the defined types were placed in this encounter.  No orders of the defined types were placed in this encounter.   I discussed the assessment and treatment plan with the patient. The patient was provided an opportunity to ask questions and all were answered. The patient agreed with the plan  and demonstrated an understanding of the instructions. The patient was advised to call back or seek an in-person evaluation if the symptoms worsen or if the condition fails to improve as anticipated.  Follow up plan: No follow-ups on file.  Audria Nine, NP

## 2020-11-14 ENCOUNTER — Ambulatory Visit: Payer: No Typology Code available for payment source | Admitting: Sports Medicine

## 2020-11-14 VITALS — BP 104/68 | Ht 68.0 in | Wt 162.0 lb

## 2020-11-14 DIAGNOSIS — M7632 Iliotibial band syndrome, left leg: Secondary | ICD-10-CM | POA: Diagnosis not present
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
## 2020-12-04 ENCOUNTER — Encounter: Payer: Self-pay | Admitting: Sports Medicine

## 2020-12-05 ENCOUNTER — Ambulatory Visit: Payer: No Typology Code available for payment source | Admitting: Sports Medicine

## 2020-12-05 VITALS — BP 110/70 | Ht 68.0 in | Wt 160.0 lb

## 2020-12-05 DIAGNOSIS — M7632 Iliotibial band syndrome, left leg: Secondary | ICD-10-CM

## 2020-12-05 DIAGNOSIS — M6752 Plica syndrome, left knee: Secondary | ICD-10-CM

## 2020-12-05 DIAGNOSIS — M25562 Pain in left knee: Secondary | ICD-10-CM | POA: Diagnosis not present

## 2020-12-05 MED ORDER — METHYLPREDNISOLONE ACETATE 40 MG/ML IJ SUSP
40.0000 mg | Freq: Once | INTRAMUSCULAR | Status: AC
  Administered 2020-12-05: 12:00:00 40 mg via INTRA_ARTICULAR

## 2020-12-05 NOTE — Patient Instructions (Signed)
It was great to see you again today, thank you for letting me participate in your care. Hope this knees feels better for you. Have fun in [place].  -The steroid injection should help over the next few days -Be sure to ice the knee over the next 1-3 days for about 20 minutes a few times a day for any pain or postinjection pain -You may continue taking Advil with food as needed -Continue your home exercises for the IT band, we will add a few for the medial aspect of the knee with quadricep strengthening and stretches. -We ordered x-rays of the knee today, get those anytime in the next few days.  We will review these at her follow-up  We will follow-up in about 4-5 weeks  If you have any further questions, please give the clinic a call [phone].  Cheers,  [name], DO [facility]
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
## 2020-12-28 ENCOUNTER — Ambulatory Visit
Admission: RE | Admit: 2020-12-28 | Discharge: 2020-12-28 | Disposition: A | Discharge status: Home / Self Care | Payer: No Typology Code available for payment source | Source: Ambulatory Visit | Attending: Sports Medicine | Admitting: Sports Medicine

## 2020-12-28 DIAGNOSIS — M25562 Pain in left knee: Secondary | ICD-10-CM

## 2021-01-31 ENCOUNTER — Encounter: Payer: No Typology Code available for payment source | Admitting: Family Medicine

## 2021-04-10 ENCOUNTER — Other Ambulatory Visit: Payer: Self-pay | Admitting: Obstetrics and Gynecology

## 2021-04-10 DIAGNOSIS — R928 Other abnormal and inconclusive findings on diagnostic imaging of breast: Secondary | ICD-10-CM

## 2021-04-11 ENCOUNTER — Ambulatory Visit (INDEPENDENT_AMBULATORY_CARE_PROVIDER_SITE_OTHER): Payer: No Typology Code available for payment source | Admitting: Family Medicine

## 2021-04-11 ENCOUNTER — Encounter: Payer: Self-pay | Admitting: Family Medicine

## 2021-04-11 VITALS — BP 100/62 | HR 61 | Temp 98.0°F | Resp 17 | Ht 68.0 in | Wt 168.0 lb

## 2021-04-11 DIAGNOSIS — Z131 Encounter for screening for diabetes mellitus: Secondary | ICD-10-CM | POA: Diagnosis not present

## 2021-04-11 DIAGNOSIS — E785 Hyperlipidemia, unspecified: Secondary | ICD-10-CM | POA: Diagnosis not present

## 2021-04-11 DIAGNOSIS — H9202 Otalgia, left ear: Secondary | ICD-10-CM | POA: Diagnosis not present

## 2021-04-11 DIAGNOSIS — Z23 Encounter for immunization: Secondary | ICD-10-CM

## 2021-04-11 DIAGNOSIS — M25512 Pain in left shoulder: Secondary | ICD-10-CM

## 2021-04-11 DIAGNOSIS — G8929 Other chronic pain: Secondary | ICD-10-CM

## 2021-04-11 MED ORDER — MELOXICAM 7.5 MG PO TABS: 7.5000 mg | ORAL_TABLET | Freq: Every day | ORAL | 0 refills | Status: DC
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
## 2021-04-11 NOTE — Patient Instructions (Addendum)
Please have xray of shoulder in next week.  ?Start mobic once per day for next few weeks. Range of motion, stretches of neck. Heat or ice to muscle spasm if needed.See info below. Recheck in next 1 month. ?Ear looks ok today. May be from sinuses/allergies. Ok to use antihistamine and flonase over the counter but follow up if ear pain returns.  ?1st shingles vaccine today.  ?Fasting labs today. Depending on cholesterol results can discuss meds or other testing options.  ?Thanks for coming in today.  ? ?Shoulder Pain ?Many things can cause shoulder pain, including: ?An injury to the shoulder. ?Overuse of the shoulder. ?Arthritis. ?The source of the pain can be: ?Inflammation. ?An injury to the shoulder joint. ?An injury to a tendon, ligament, or bone. ?Follow these instructions at home: ?Pay attention to changes in your symptoms. Let your health care provider know about them. Follow these instructions to relieve your pain. ?If you have a sling: ?Wear the sling as told by your health care provider. Remove it only as told by your health care provider. ?Loosen the sling if your fingers tingle, become numb, or turn cold and blue. ?Keep the sling clean. ?If the sling is not waterproof: ?Do not let it get wet. Remove it to shower or bathe. ?Move your arm as little as possible, but keep your hand moving to prevent swelling. ?Managing pain, stiffness, and swelling ? ?If directed, put ice on the painful area: ?Put ice in a plastic bag. ?Place a towel between your skin and the bag. ?Leave the ice on for 20 minutes, 2-3 times per day. Stop applying ice if it does not help with the pain. ?Squeeze a soft ball or a foam pad as much as possible. This helps to keep the shoulder from swelling. It also helps to strengthen the arm. ?General instructions ?Take over-the-counter and prescription medicines only as told by your health care provider. ?Keep all follow-up visits as told by your health care provider. This is important. ?Contact  a health care provider if: ?Your pain gets worse. ?Your pain is not relieved with medicines. ?New pain develops in your arm, hand, or fingers. ?Get help right away if: ?Your arm, hand, or fingers: ?Tingle. ?Become numb. ?Become swollen. ?Become painful. ?Turn white or blue. ?Summary ?Shoulder pain can be caused by an injury, overuse, or arthritis. ?Pay attention to changes in your symptoms. Let your health care provider know about them. ?This condition may be treated with a sling, ice, and pain medicines. ?Contact your health care provider if the pain gets worse or new pain develops. Get help right away if your arm, hand, or fingers tingle or become numb, swollen, or painful. ?Keep all follow-up visits as told by your health care provider. This is important. ?This information is not intended to replace advice given to you by your health care provider. Make sure you discuss any questions you have with your health care provider. ?Document Revised: 07/28/2017 Document Reviewed: 07/28/2017 ?Elsevier Patient Education ? 2022 Elsevier Inc. ? ? ? ?

## 2021-04-12 LAB — COMPREHENSIVE METABOLIC PANEL
ALT: 11 U/L (ref 0–35)
AST: 17 U/L (ref 0–37)
Albumin: 4.4 g/dL (ref 3.5–5.2)
Alkaline Phosphatase: 104 U/L (ref 39–117)
BUN: 14 mg/dL (ref 6–23)
CO2: 30 mEq/L (ref 19–32)
Calcium: 9.9 mg/dL (ref 8.4–10.5)
Chloride: 101 mEq/L (ref 96–112)
Creatinine, Ser: 0.91 mg/dL (ref 0.40–1.20)
GFR: 69.56 mL/min (ref >60.00)
Glucose, Bld: 87 mg/dL (ref 70–99)
Potassium: 4.3 mEq/L (ref 3.5–5.1)
Sodium: 139 mEq/L (ref 135–145)
Total Bilirubin: 0.6 mg/dL (ref 0.2–1.2)
Total Protein: 6.9 g/dL (ref 6.0–8.3)

## 2021-04-12 LAB — LIPID PANEL
Cholesterol: 232 mg/dL — ABNORMAL HIGH (ref 0–200)
HDL: 64.3 mg/dL (ref >39.00)
LDL Cholesterol: 148 mg/dL — ABNORMAL HIGH (ref 0–99)
NonHDL: 167.56
Total CHOL/HDL Ratio: 4
Triglycerides: 96 mg/dL (ref 0.0–149.0)
VLDL: 19.2 mg/dL (ref 0.0–40.0)

## 2021-04-12 LAB — HEMOGLOBIN A1C: Hgb A1c MFr Bld: 5.7 % (ref 4.6–6.5)

## 2021-04-26 ENCOUNTER — Ambulatory Visit
Admission: RE | Admit: 2021-04-26 | Discharge: 2021-04-26 | Disposition: A | Discharge status: Home / Self Care | Payer: No Typology Code available for payment source | Source: Ambulatory Visit | Attending: Obstetrics and Gynecology | Admitting: Obstetrics and Gynecology

## 2021-04-26 ENCOUNTER — Other Ambulatory Visit: Payer: Self-pay | Admitting: Obstetrics and Gynecology

## 2021-04-26 DIAGNOSIS — N6310 Unspecified lump in the right breast, unspecified quadrant: Secondary | ICD-10-CM

## 2021-04-26 DIAGNOSIS — R928 Other abnormal and inconclusive findings on diagnostic imaging of breast: Secondary | ICD-10-CM

## 2021-04-29 ENCOUNTER — Ambulatory Visit (INDEPENDENT_AMBULATORY_CARE_PROVIDER_SITE_OTHER)
Admission: RE | Admit: 2021-04-29 | Discharge: 2021-04-29 | Disposition: A | Discharge status: Home / Self Care | Payer: No Typology Code available for payment source | Source: Ambulatory Visit | Attending: Family Medicine | Admitting: Family Medicine

## 2021-04-29 ENCOUNTER — Other Ambulatory Visit: Payer: No Typology Code available for payment source

## 2021-04-29 DIAGNOSIS — M25512 Pain in left shoulder: Secondary | ICD-10-CM | POA: Diagnosis not present

## 2021-04-29 DIAGNOSIS — G8929 Other chronic pain: Secondary | ICD-10-CM | POA: Diagnosis not present

## 2021-05-01 ENCOUNTER — Other Ambulatory Visit: Payer: Self-pay | Admitting: Obstetrics and Gynecology

## 2021-05-01 DIAGNOSIS — Z9189 Other specified personal risk factors, not elsewhere classified: Secondary | ICD-10-CM

## 2021-05-17 ENCOUNTER — Encounter: Payer: No Typology Code available for payment source | Admitting: Family Medicine

## 2021-05-22 ENCOUNTER — Ambulatory Visit
Admission: RE | Admit: 2021-05-22 | Discharge: 2021-05-22 | Disposition: A | Discharge status: Home / Self Care | Payer: No Typology Code available for payment source | Source: Ambulatory Visit | Attending: Obstetrics and Gynecology | Admitting: Obstetrics and Gynecology

## 2021-05-22 DIAGNOSIS — Z9189 Other specified personal risk factors, not elsewhere classified: Secondary | ICD-10-CM

## 2021-05-22 MED ORDER — GADOBUTROL 1 MMOL/ML IV SOLN
8.0000 mL | Freq: Once | INTRAVENOUS | Status: AC | PRN
  Administered 2021-05-22: 8 mL via INTRAVENOUS

## 2021-06-13 ENCOUNTER — Ambulatory Visit (INDEPENDENT_AMBULATORY_CARE_PROVIDER_SITE_OTHER): Payer: No Typology Code available for payment source | Admitting: Family Medicine

## 2021-06-13 DIAGNOSIS — Z23 Encounter for immunization: Secondary | ICD-10-CM | POA: Diagnosis not present

## 2021-06-13 NOTE — Progress Notes (Signed)
Patient was in office to receive her 2nd shingles vaccine in left deltoid

## 2021-06-14 ENCOUNTER — Ambulatory Visit: Payer: No Typology Code available for payment source

## 2021-06-18 ENCOUNTER — Emergency Department (HOSPITAL_BASED_OUTPATIENT_CLINIC_OR_DEPARTMENT_OTHER): Payer: No Typology Code available for payment source | Admitting: Radiology

## 2021-06-18 ENCOUNTER — Other Ambulatory Visit: Payer: Self-pay

## 2021-06-18 ENCOUNTER — Other Ambulatory Visit (HOSPITAL_BASED_OUTPATIENT_CLINIC_OR_DEPARTMENT_OTHER): Payer: Self-pay

## 2021-06-18 ENCOUNTER — Emergency Department (HOSPITAL_BASED_OUTPATIENT_CLINIC_OR_DEPARTMENT_OTHER)
Admission: EM | Admit: 2021-06-18 | Discharge: 2021-06-19 | Disposition: A | Discharge status: Home / Self Care | Payer: No Typology Code available for payment source | Attending: Emergency Medicine | Admitting: Emergency Medicine

## 2021-06-18 ENCOUNTER — Encounter (HOSPITAL_BASED_OUTPATIENT_CLINIC_OR_DEPARTMENT_OTHER): Payer: Self-pay

## 2021-06-18 DIAGNOSIS — W010XXA Fall on same level from slipping, tripping and stumbling without subsequent striking against object, initial encounter: Secondary | ICD-10-CM | POA: Diagnosis not present

## 2021-06-18 DIAGNOSIS — Y9364 Activity, baseball: Secondary | ICD-10-CM | POA: Diagnosis not present

## 2021-06-18 DIAGNOSIS — S5012XA Contusion of left forearm, initial encounter: Secondary | ICD-10-CM | POA: Diagnosis not present

## 2021-06-18 DIAGNOSIS — S2242XA Multiple fractures of ribs, left side, initial encounter for closed fracture: Secondary | ICD-10-CM | POA: Diagnosis not present

## 2021-06-18 DIAGNOSIS — S299XXA Unspecified injury of thorax, initial encounter: Secondary | ICD-10-CM | POA: Diagnosis present

## 2021-06-18 DIAGNOSIS — S93402A Sprain of unspecified ligament of left ankle, initial encounter: Secondary | ICD-10-CM

## 2021-06-18 MED ORDER — BACITRACIN ZINC 500 UNIT/GM EX OINT
TOPICAL_OINTMENT | CUTANEOUS | Status: AC
  Filled 2021-06-18: qty 28.35

## 2021-06-18 MED ORDER — METHOCARBAMOL 500 MG PO TABS: 500.0000 mg | ORAL_TABLET | Freq: Two times a day (BID) | ORAL | 0 refills | Status: DC

## 2021-06-18 NOTE — ED Provider Notes (Signed)
[facility] EMERGENCY DEPT Provider Note   CSN: [id number] Arrival date & time: 06/18/21  2121     History  Chief Complaint  Patient presents with   Lytle Michaels    Laura [name] is a 61 y.o. female.   Fall  Patient is a 61 year old female with past medical history significant for joint pain, neck pain, PVCs, hoarse voice  Patient presented emergency room today with complaints of left forearm, left ankle, left rib cage pain after she tripped 1 prior to arrival in the emergency room while trying to step over a [place] the baseball [place].  She fell onto her left side.  She thinks that her left thorax went across the line of chairs.  She has bruising to her left forearm and a small cut, some bruising over her left ribs and some swelling of her left ankle.  She was able to walk after this incident.  She did not strike her head or lose consciousness no nausea or vomiting no confusion.  She is not on any blood thinners.  No other associated areas of pain no medications prior to arrival       Home Medications Prior to Admission medications   Medication Sig Start Date End Date Taking? Authorizing Provider  methocarbamol (ROBAXIN) 500 MG tablet Take 1 tablet (500 mg total) by mouth 2 (two) times daily. 06/18/21  Yes Cahlil Sattar, Ova Fresh[place] S, PA  Ascorbic Acid (VITAMIN C WITH ROSE HIPS) 500 MG tablet Take 500 mg by mouth daily.    [provider]  B Complex Vitamins (VITAMIN B COMPLEX PO) Take by mouth.    [provider]  Cholecalciferol (VITAMIN D3) 50 MCG (2000 UT) TABS Take 1 tablet by mouth daily.    [provider]  clobetasol (TEMOVATE) 0.05 % external solution clobetasol 0.05 % scalp solution    [provider]  glucosamine-chondroitin 500-400 MG tablet Take 1 tablet by mouth daily.      [provider]  meloxicam (MOBIC) 7.5 MG tablet Take 1 tablet (7.5 mg total) by mouth daily. 04/11/21   Wendie Agreste, MD  Multiple  Vitamin (MULTI-VITAMINS) TABS Take by mouth.    [provider]  Omega-3 Fatty Acids (FISH OIL) 1200 MG CAPS Take 1 capsule by mouth daily.    [provider]  Probiotic Product (PROBIOTIC FORMULA) CAPS Take 1 capsule by mouth daily. Cultrelle    [provider]  TURMERIC CURCUMIN PO Take 700 mg by mouth daily.    [provider]      Allergies    Erythromycin    Review of Systems   Review of Systems  Physical Exam Updated Vital Signs BP 132/88 (BP Location: Right Arm)   Pulse 69   Temp 98 F (36.7 C)   Resp 18   Ht 5\' 8"  (1.727 m)   Wt 76.2 kg   SpO2 99%   BMI 25.54 kg/m  Physical Exam Vitals and nursing note reviewed.  Constitutional:      General: She is not in acute distress.    Appearance: Normal appearance. She is not ill-appearing.  HENT:     Head: Normocephalic and atraumatic.  Eyes:     General: No scleral icterus.       Right eye: No discharge.        Left eye: No discharge.     Conjunctiva/sclera: Conjunctivae normal.  Pulmonary:     Effort: Pulmonary effort is normal.     Breath  sounds: No stridor.     Comments: Speaking in full sentences, no tachypnea, no hypoxia, lungs are clear, no flail chest Musculoskeletal:     Comments: Tenderness palpation of the left mid forearm, tenderness to palpation of left lateral malleolus of the ankle noted tenderness of left rib cage.  There is bruising in all 3 areas.   No OTHER bony tenderness over joints or long bones of the upper and lower extremities.    No neck or back midline tenderness, step-off, deformity, or bruising. Able to turn head left and right 45 degrees without difficulty.  Full range of motion of upper and lower extremity joints shown after palpation was conducted; with 5/5 symmetrical strength in upper and lower extremities. No chest wall tenderness, no facial or cranial tenderness.   Patient has intact sensation grossly in lower and upper extremities. Intact  patellar and ankle reflexes. Patient able to ambulate without difficulty.  Radial and DP pulses palpated BL.     Skin:    Comments: Superficial scrape to left forearm with some surrounding bruising.  Neurological:     Mental Status: She is alert and oriented to person, place, and time. Mental status is at baseline.    ED Results / Procedures / Treatments   Labs (all labs ordered are listed, but only abnormal results are displayed) Labs Reviewed - No data to display  EKG None  Radiology DG Ribs Unilateral W/Chest Left  Result Date: 06/18/2021 CLINICAL DATA:  Pain after a fall. EXAM: LEFT RIBS AND CHEST - 3+ VIEW COMPARISON:  05/12/2013 FINDINGS: Normal heart size and pulmonary vascularity. No focal airspace disease or consolidation in the lungs. No blunting of costophrenic angles. No pneumothorax. Mediastinal contours appear intact. Acute fractures of the anterolateral left ninth, eighth, and seventh ribs. No significant displacement. No focal bone lesions. Soft tissues are unremarkable. IMPRESSION: 1. No evidence of active pulmonary disease. 2. Acute nondisplaced fractures of the left ninth, eighth, and seventh ribs. Electronically Signed   By: Lucienne Capers M.D.   On: 06/18/2021 23:16   DG Forearm Left  Result Date: 06/18/2021 CLINICAL DATA:  Pain after a fall. EXAM: LEFT FOREARM - 2 VIEW COMPARISON:  None Available. FINDINGS: No evidence of acute fracture or dislocation of the left radius or ulna. Prominent focal soft tissue swelling along the dorsum of the distal left forearm consistent with soft tissue contusion. IMPRESSION: Soft tissue contusion over the dorsal distal forearm. No acute bony abnormalities. Electronically Signed   By: Lucienne Capers M.D.   On: 06/18/2021 23:13   DG Ankle Complete Left  Result Date: 06/18/2021 CLINICAL DATA:  Pain after a fall. EXAM: LEFT ANKLE COMPLETE - 3+ VIEW COMPARISON:  None Available. FINDINGS: No evidence of acute fracture or dislocation.  No focal bone lesions. Soft tissue swelling over the anterior left ankle. Joint space is normal. Small calcaneal spur. IMPRESSION: Anterior soft tissue swelling. No acute displaced fractures identified. Electronically Signed   By: Lucienne Capers M.D.   On: 06/18/2021 23:14    Procedures Procedures    Medications Ordered in ED Medications  bacitracin ointment (has no administration in time range)    ED Course/ Medical Decision Making/ A&P                           Medical Decision Making Amount and/or Complexity of Data Reviewed Radiology: ordered.   Patient here after mechanical fall.  She has tenderness and bruising over the left  thorax left forearm and left ankle.  Independently ordered and reviewed x-rays I looked at the images and I do not appreciate any fractures of the forearm or ankle. There are 3 nondisplaced rib fractures that I have difficulty reviewing on the images of the chest x-ray.  I defer to the radiologist and their interpretation.  Patient feels relatively well.  We will place an ASO ankle brace for her sprained ankle and offer crutches, will provide patient with bacitracin ointment on small abrasion on her left forearm a bandage and an Ace wrap to help provide compression.  For the rib fractures patient has no flail chest, recommended incentive spirometer, she is well-appearing overall.  Normal vital signs.  She will need to follow-up with PCP.  Return precautions given.  Tylenol ibuprofen recommendations made and Robaxin for additional pain medicine.  Final Clinical Impression(s) / ED Diagnoses Final diagnoses:  Closed fracture of multiple ribs of left side, initial encounter  Sprain of left ankle, unspecified ligament, initial encounter  Contusion of left forearm, initial encounter    Rx / DC Orders ED Discharge Orders          Ordered    methocarbamol (ROBAXIN) 500 MG tablet  2 times daily        06/18/21 2325              Tedd Sias,  [place] [id number]    Lianne Cure, DO [id number]

## 2021-06-18 NOTE — ED Triage Notes (Signed)
Patient here POV from Home.  Endorses approximately 1 Hour PTA being at a [employer] when she stepped on a Folding Chair to Step Over it and fell forward.  No LOC. No Head Injury. Pain to Left Torso, Ankle and Forearm. No Anticoagulants.  NAD Note during Triage. A&Ox4. GCS 15. BIB Wheelchair.

## 2021-06-18 NOTE — ED Notes (Signed)
Pt now returned from imaging via stretcher - results pending

## 2021-06-18 NOTE — Discharge Instructions (Signed)
You have 3 rib fractures that are lined up well. You will experience pain in the left side of the chest this will likely hurt with deep breaths.  Make sure that you use the incentive spirometer that we have provided you.  Tylenol and ibuprofen as discussed below please take this regularly and use a compression Ace wrap to your forearm this will help with the swelling.  Ice and elevation as well.  I have written you prescription for a muscle relaxer which is an additional pain medicine that you may use for pain as needed at home.  He can cause some dizziness and drowsiness.  Please use Tylenol or ibuprofen for pain.  You may use 600 mg ibuprofen every 6 hours or 1000 mg of Tylenol every 6 hours.  You may choose to alternate between the 2.  This would be most effective.  Not to exceed 4 g of Tylenol within 24 hours.  Not to exceed 3200 mg ibuprofen 24 hours.

## 2021-06-19 NOTE — ED Notes (Signed)
Pt agreeable with d/c instructions as discussed by provider- this nurse has verbally reinforced d/c instructions and provided pt with written copy - pt acknowledges verbal understanding and denies any additional questions, concerns, needs - escorted to exit via w/c - pt able to bare weight on LLE and xfer short distances as this time

## 2021-06-19 NOTE — ED Notes (Signed)
L FA abrasion cleansed with wound dermal cleanser; patted dry thoroughly - light coat of Bacitracin applied and site then covered with non-adherent dressing and wrapped with cobane; no active bleeding -- Ace bandage then applied to L FA secondary to pain and soft tissue edema; well tolerated by pt.  L FA PMS remains intact.  Separately ED tech has applied L ankle lace-up brace (medium size) - well tolerated by pt

## 2021-06-26 ENCOUNTER — Ambulatory Visit: Payer: No Typology Code available for payment source | Admitting: Sports Medicine

## 2021-06-26 VITALS — BP 124/82 | Ht 68.5 in | Wt 164.0 lb

## 2021-06-26 DIAGNOSIS — W108XXA Fall (on) (from) other stairs and steps, initial encounter: Secondary | ICD-10-CM

## 2021-06-26 DIAGNOSIS — S5012XA Contusion of left forearm, initial encounter: Secondary | ICD-10-CM | POA: Diagnosis not present

## 2021-06-26 DIAGNOSIS — T148XXA Other injury of unspecified body region, initial encounter: Secondary | ICD-10-CM

## 2021-06-26 DIAGNOSIS — S2242XA Multiple fractures of ribs, left side, initial encounter for closed fracture: Secondary | ICD-10-CM | POA: Diagnosis not present

## 2021-06-26 NOTE — Progress Notes (Signed)
PCP: Wendie Agreste, MD  Subjective:   HPI: Laura [name] is a pleasant 61 y.o. female here for follow-up after ED visit on 06/18/2021 in which she suffered 3 nondisplaced rib fractures and sustained a fall injuring her ankle and bruising her forearm.  She tripped over seeds in the baseball [place] and fell onto the left side with her left thorax landing on the line of chairs.  Independent review of unilateral rib x-rays on 06/18/21 demonstrates fractures of the costal margin of the seventh, eighth and ninth ribs.  Independent review of left ankle x-ray demonstrates a small plantar calcaneal spur, there is soft tissue swelling of the no discrete fractures.  2 view of the left forearm was identified without evidence of acute fracture or dislocation of the radius or ulna.  Today, she is doing somewhat better, although still mildly sore.  Her left ankle is doing much better and she is walking without difficulty.  She still gets pain over the left costal cage and ribs, although bruising and pain is less.  She did receive an incentive spirometer from the ED and has been using this to keep her lungs open. The forearm is bruised and sore but she is thankful it is not broken.  She wanted some guidance on return to activity as well as work.   BP 124/82   Ht 5' 8.5" (1.74 m)   Wt 164 lb (74.4 kg)   BMI 24.57 kg/m      11/14/2020   11:18 AM 12/05/2020   11:10 AM  [facility]  Frequency of aerobic exercise (# of days/week) 4 4  Average time in minutes 35 35  Frequency of strengthening activities (# of days/week) 2 2        View : No data to display.              Objective:  Physical Exam:  Gen: Well-appearing, in no acute distress; non-toxic CV: Regular Rate. Well-perfused. Warm.  Resp: Breathing unlabored on room air; no wheezing. Psych: Fluid speech in conversation; appropriate affect; normal thought process Neuro: Sensation intact throughout. No gross coordination  deficits.  MSK:  -The patient has some superficial bruising over the left mid-distal forearm with some soft tissue swelling overlying.  She has no tenderness to the bone proximal or distal to the bruise.  She has full range of motion and strength at the hand and wrist.  There is some superficial bruising on the lateral aspect of the ankle although range of motion is preserved.  Negative anterior drawer, negative inversion/eversion stress testing.  There is some superficial bruising and tenderness to palpation over the lateral costal margin of ribs 7-10.  Respirations are regular and symmetric rise and fall of the costal cage.  There is some tenderness to palpation over the right SI joint although full range of motion and strength about the hip and right lower extremity in all directions    Assessment & Plan:  1. Fall, initial encounter with sequalae 2. Closed, non-/minimally displaced rib fractures of the left 7-9 ribs 3.  Left forearm contusion 4.  Right-sided low back pain s/p fall 5. Contusion of left ankle   [name] presents today after 1 week of an unfortunate fall going over a line of seats at the ballpark.  She had x-rays of multiple joints at the ED which did show some nondisplaced rib fractures of 7-9 on the left.  She is still somewhat sore, although in general her pain is improving.  This is controlled on Advil as needed.  I encouraged her to use her incentive spirometer once or twice a day to keep her lungs open.  I believe it is safe for her to return to walking for activity.  Cautioned her on lifting anything more than 5 pounds and twisting motions about the upper extremity and costal cage.  Did state it takes about 6 weeks for full healing of her rib fractures.  She may do some gentle digital massage and heat to her bruising and the hematoma of the left forearm.  Would expect this to get better over the next few weeks.  If she is not markedly improved over the next 4 weeks, she may present  for reevaluation.  May call or message me in the interim for any other questions.  Elba Barman, DO PGY-4, Sports Medicine Fellow [place]  This note was dictated using Dragon naturally speaking software and may contain errors in syntax, spelling, or content which have not been identified prior to signing this note.   Addendum:  I was the preceptor for this visit and available for immediate consultation.  Karlton Lemon MD Kirt Boys

## 2021-09-05 ENCOUNTER — Ambulatory Visit: Payer: Self-pay

## 2021-09-05 ENCOUNTER — Ambulatory Visit: Payer: No Typology Code available for payment source | Admitting: Sports Medicine

## 2021-09-05 VITALS — BP 110/70 | Ht 68.5 in

## 2021-09-05 DIAGNOSIS — M25562 Pain in left knee: Secondary | ICD-10-CM
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
## 2021-09-13 ENCOUNTER — Ambulatory Visit: Payer: No Typology Code available for payment source | Admitting: Sports Medicine

## 2021-09-13 ENCOUNTER — Encounter: Payer: Self-pay | Admitting: Sports Medicine

## 2021-09-13 DIAGNOSIS — M23305 Other meniscus derangements, unspecified medial meniscus, unspecified knee: Secondary | ICD-10-CM

## 2021-09-13 DIAGNOSIS — M23307 Other meniscus derangements, unspecified meniscus, left knee: Secondary | ICD-10-CM

## 2021-09-13 DIAGNOSIS — M25562 Pain in left knee: Secondary | ICD-10-CM
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
## 2021-09-13 NOTE — Progress Notes (Signed)
Left knee bothering her a lot again. Felt "pop" recently stepping sideways. Has tried resting, ice, elevating. Has been using compression brace and Ibuprofen. Has recently seen Dr. Darrick Penna. Not a constant pain, but when she is hurting, every step she takes is very painful. Some swelling. Maybe a cortisone injection today?

## 2021-10-17 ENCOUNTER — Ambulatory Visit: Payer: No Typology Code available for payment source | Admitting: Sports Medicine

## 2021-10-29 ENCOUNTER — Other Ambulatory Visit: Payer: No Typology Code available for payment source

## 2021-11-06 ENCOUNTER — Other Ambulatory Visit: Payer: Self-pay | Admitting: Obstetrics and Gynecology

## 2021-11-06 ENCOUNTER — Ambulatory Visit
Admission: RE | Admit: 2021-11-06 | Discharge: 2021-11-06 | Disposition: A | Discharge status: Home / Self Care | Payer: No Typology Code available for payment source | Source: Ambulatory Visit | Attending: Obstetrics and Gynecology | Admitting: Obstetrics and Gynecology

## 2021-11-06 DIAGNOSIS — N6310 Unspecified lump in the right breast, unspecified quadrant: Secondary | ICD-10-CM

## 2021-11-06 DIAGNOSIS — R928 Other abnormal and inconclusive findings on diagnostic imaging of breast: Secondary | ICD-10-CM

## 2022-04-22 ENCOUNTER — Other Ambulatory Visit: Payer: No Typology Code available for payment source

## 2022-05-22 ENCOUNTER — Other Ambulatory Visit: Payer: Self-pay | Admitting: Obstetrics and Gynecology

## 2022-05-22 DIAGNOSIS — Z9189 Other specified personal risk factors, not elsewhere classified: Secondary | ICD-10-CM

## 2022-05-28 ENCOUNTER — Ambulatory Visit
Admission: RE | Admit: 2022-05-28 | Discharge: 2022-05-28 | Disposition: A | Discharge status: Home / Self Care | Payer: No Typology Code available for payment source | Source: Ambulatory Visit | Attending: Obstetrics and Gynecology | Admitting: Obstetrics and Gynecology

## 2022-05-28 DIAGNOSIS — N6310 Unspecified lump in the right breast, unspecified quadrant: Secondary | ICD-10-CM

## 2022-06-27 ENCOUNTER — Telehealth: Payer: Self-pay | Admitting: Family Medicine

## 2022-06-27 NOTE — Telephone Encounter (Signed)
Patient called and states will be traveling to [place] in June and would like to know can Malaria Pills be prescribed if so please send to [facility] in [place] center. She also was wondering if her son can get an RX as well but is not patient and has no PCP. Please call

## 2022-06-27 NOTE — Telephone Encounter (Signed)
Called patient to inform them they would need an appt for travel concerns.

## 2022-06-27 NOTE — Telephone Encounter (Signed)
Patient will need an appointment, we cannot prescribe to patients who are not established he will have to be established and then see for this Laura [name] is taking new patients at this time but both will need an appointment for travel concerns

## 2022-08-07 ENCOUNTER — Ambulatory Visit
Admission: RE | Admit: 2022-08-07 | Discharge: 2022-08-07 | Disposition: A | Discharge status: Home / Self Care | Payer: No Typology Code available for payment source | Source: Ambulatory Visit | Attending: Obstetrics and Gynecology | Admitting: Obstetrics and Gynecology

## 2022-08-07 DIAGNOSIS — Z9189 Other specified personal risk factors, not elsewhere classified: Secondary | ICD-10-CM

## 2022-08-07 MED ORDER — GADOPICLENOL 0.5 MMOL/ML IV SOLN
7.0000 mL | Freq: Once | INTRAVENOUS | Status: AC | PRN
  Administered 2022-08-07: 7 mL via INTRAVENOUS

## 2023-03-02 DIAGNOSIS — F4323 Adjustment disorder with mixed anxiety and depressed mood: Secondary | ICD-10-CM | POA: Diagnosis not present

## 2023-05-28 ENCOUNTER — Other Ambulatory Visit: Payer: Self-pay | Admitting: Obstetrics and Gynecology

## 2023-05-28 DIAGNOSIS — Z9189 Other specified personal risk factors, not elsewhere classified: Secondary | ICD-10-CM

## 2023-07-06 ENCOUNTER — Other Ambulatory Visit: Payer: Self-pay | Admitting: Obstetrics and Gynecology

## 2023-07-06 DIAGNOSIS — Z1231 Encounter for screening mammogram for malignant neoplasm of breast: Secondary | ICD-10-CM

## 2023-07-08 ENCOUNTER — Encounter: Payer: Self-pay | Admitting: Radiology

## 2023-07-08 ENCOUNTER — Ambulatory Visit
Admission: RE | Admit: 2023-07-08 | Discharge: 2023-07-08 | Discharge status: Home / Self Care | Source: Ambulatory Visit | Attending: Obstetrics and Gynecology

## 2023-07-08 DIAGNOSIS — Z1231 Encounter for screening mammogram for malignant neoplasm of breast: Secondary | ICD-10-CM

## 2023-07-10 ENCOUNTER — Ambulatory Visit (INDEPENDENT_AMBULATORY_CARE_PROVIDER_SITE_OTHER): Payer: Self-pay | Admitting: Neurology

## 2023-07-10 ENCOUNTER — Encounter: Payer: Self-pay | Admitting: Neurology

## 2023-07-10 VITALS — BP 102/74 | HR 64 | Ht 68.5 in | Wt 171.0 lb

## 2023-07-10 DIAGNOSIS — R202 Paresthesia of skin: Secondary | ICD-10-CM | POA: Diagnosis not present
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
## 2023-07-13 ENCOUNTER — Other Ambulatory Visit: Payer: Self-pay | Admitting: Obstetrics and Gynecology

## 2023-07-13 ENCOUNTER — Ambulatory Visit: Payer: Self-pay | Admitting: Neurology

## 2023-07-13 DIAGNOSIS — R928 Other abnormal and inconclusive findings on diagnostic imaging of breast: Secondary | ICD-10-CM

## 2023-07-13 LAB — COMPREHENSIVE METABOLIC PANEL WITH GFR
ALT: 12 IU/L (ref 0–32)
AST: 22 IU/L (ref 0–40)
Albumin: 4.4 g/dL (ref 3.8–4.9)
Alkaline Phosphatase: 122 IU/L — ABNORMAL HIGH (ref 44–121)
BUN/Creatinine Ratio: 18 (ref 12–28)
BUN: 15 mg/dL (ref 8–27)
Bilirubin Total: 0.4 mg/dL (ref 0.0–1.2)
CO2: 24 mmol/L (ref 20–29)
Calcium: 9.6 mg/dL (ref 8.7–10.3)
Chloride: 104 mmol/L (ref 96–106)
Creatinine, Ser: 0.82 mg/dL (ref 0.57–1.00)
Globulin, Total: 2.2 g/dL (ref 1.5–4.5)
Glucose: 107 mg/dL — ABNORMAL HIGH (ref 70–99)
Potassium: 4.2 mmol/L (ref 3.5–5.2)
Sodium: 144 mmol/L (ref 134–144)
Total Protein: 6.6 g/dL (ref 6.0–8.5)
eGFR: 82 mL/min/{1.73_m2} (ref >59)

## 2023-07-13 LAB — CBC WITH DIFFERENTIAL/PLATELET
Basophils Absolute: 0.1 10*3/uL (ref 0.0–0.2)
Basos: 1 %
EOS (ABSOLUTE): 0.1 10*3/uL (ref 0.0–0.4)
Eos: 2 %
Hematocrit: 41.3 % (ref 34.0–46.6)
Hemoglobin: 13.5 g/dL (ref 11.1–15.9)
Immature Grans (Abs): 0 10*3/uL (ref 0.0–0.1)
Immature Granulocytes: 0 %
Lymphocytes Absolute: 1.8 10*3/uL (ref 0.7–3.1)
Lymphs: 32 %
MCH: 30.6 pg (ref 26.6–33.0)
MCHC: 32.7 g/dL (ref 31.5–35.7)
MCV: 94 fL (ref 79–97)
Monocytes Absolute: 0.4 10*3/uL (ref 0.1–0.9)
Monocytes: 8 %
Neutrophils Absolute: 3.2 10*3/uL (ref 1.4–7.0)
Neutrophils: 57 %
Platelets: 237 10*3/uL (ref 150–450)
RBC: 4.41 x10E6/uL (ref 3.77–5.28)
RDW: 12.9 % (ref 11.7–15.4)
WBC: 5.6 10*3/uL (ref 3.4–10.8)

## 2023-07-13 LAB — MULTIPLE MYELOMA PANEL, SERUM
Albumin SerPl Elph-Mcnc: 3.6 g/dL (ref 2.9–4.4)
Albumin/Glob SerPl: 1.3 (ref 0.7–1.7)
Alpha 1: 0.2 g/dL (ref 0.0–0.4)
Alpha2 Glob SerPl Elph-Mcnc: 0.7 g/dL (ref 0.4–1.0)
B-Globulin SerPl Elph-Mcnc: 1.1 g/dL (ref 0.7–1.3)
Gamma Glob SerPl Elph-Mcnc: 1 g/dL (ref 0.4–1.8)
Globulin, Total: 3 g/dL (ref 2.2–3.9)
IgA/Immunoglobulin A, Serum: 198 mg/dL (ref 87–352)
IgG (Immunoglobin G), Serum: 935 mg/dL (ref 586–1602)
IgM (Immunoglobulin M), Srm: 141 mg/dL (ref 26–217)

## 2023-07-13 LAB — CK: Total CK: 73 U/L (ref 32–182)

## 2023-07-13 LAB — C-REACTIVE PROTEIN: CRP: <1 mg/L (ref 0–10)

## 2023-07-13 LAB — VITAMIN B12: Vitamin B-12: >2000 pg/mL — ABNORMAL HIGH (ref 232–1245)

## 2023-07-13 LAB — RPR: RPR Ser Ql: NONREACTIVE

## 2023-07-13 LAB — ANA W/REFLEX IF POSITIVE: Anti Nuclear Antibody (ANA): NEGATIVE

## 2023-07-13 LAB — HGB A1C W/O EAG: Hgb A1c MFr Bld: 5.6 % (ref 4.8–5.6)

## 2023-07-13 LAB — SEDIMENTATION RATE: Sed Rate: 8 mm/h (ref 0–40)

## 2023-07-23 ENCOUNTER — Ambulatory Visit

## 2023-07-23 ENCOUNTER — Ambulatory Visit
Admission: RE | Admit: 2023-07-23 | Discharge: 2023-07-23 | Disposition: A | Discharge status: Home / Self Care | Source: Ambulatory Visit | Attending: Obstetrics and Gynecology | Admitting: Obstetrics and Gynecology

## 2023-07-23 DIAGNOSIS — R928 Other abnormal and inconclusive findings on diagnostic imaging of breast: Secondary | ICD-10-CM

## 2023-08-09 ENCOUNTER — Other Ambulatory Visit

## 2023-08-10 ENCOUNTER — Ambulatory Visit: Payer: Self-pay | Admitting: Neurology

## 2023-08-14 ENCOUNTER — Ambulatory Visit
Admission: RE | Admit: 2023-08-14 | Discharge: 2023-08-14 | Disposition: A | Discharge status: Home / Self Care | Source: Ambulatory Visit | Attending: Obstetrics and Gynecology | Admitting: Obstetrics and Gynecology

## 2023-08-14 DIAGNOSIS — Z9189 Other specified personal risk factors, not elsewhere classified: Secondary | ICD-10-CM

## 2023-08-14 MED ORDER — GADOPICLENOL 0.5 MMOL/ML IV SOLN
7.5000 mL | Freq: Once | INTRAVENOUS | Status: AC | PRN
Start: 2023-08-14 — End: 2023-08-14
  Administered 2023-08-14: 7.5 mL via INTRAVENOUS

## 2024-02-05 ENCOUNTER — Encounter: Payer: Self-pay | Admitting: Cardiology

## 2024-02-05 ENCOUNTER — Ambulatory Visit: Attending: Cardiology | Admitting: Cardiology

## 2024-02-05 VITALS — BP 98/68 | HR 74 | Ht 68.0 in | Wt 173.6 lb

## 2024-02-05 DIAGNOSIS — I341 Nonrheumatic mitral (valve) prolapse: Secondary | ICD-10-CM

## 2024-02-05 DIAGNOSIS — I83892 Varicose veins of left lower extremities with other complications: Secondary | ICD-10-CM | POA: Diagnosis not present

## 2024-02-05 DIAGNOSIS — Z136 Encounter for screening for cardiovascular disorders: Secondary | ICD-10-CM
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
## 2024-02-05 NOTE — Patient Instructions (Signed)
 Medication Instructions:  The current medical regimen is effective;  continue present plan and medications.  *If you need a refill on your cardiac medications before your next appointment, please call your pharmacy*  Testing/Procedures: Your physician has requested that you have an echocardiogram. Echocardiography is a painless test that uses sound waves to create images of your heart. It provides your doctor with information about the size and shape of your heart and how well your hearts chambers and valves are working. This procedure takes approximately one hour. There are no restrictions for this procedure. Please do NOT wear cologne, perfume, aftershave, or lotions (deodorant is allowed). Please arrive 15 minutes prior to your appointment time.  Please note: We ask at that you not bring children with you during ultrasound (echo/ vascular) testing. Due to room size and safety concerns, children are not allowed in the ultrasound rooms during exams. Our front office staff cannot provide observation of children in our lobby area while testing is being conducted. An adult accompanying a patient to their appointment will only be allowed in the ultrasound room at the discretion of the ultrasound technician under special circumstances. We apologize for any inconvenience.  Your physician has requested that you have a Coronary Calcium score which is completed by CT. Cardiac computed tomography (CT) is a painless test that uses an x-ray machine to take clear, detailed pictures of your heart. There are no instructions for this testing.  You may eat/drink and take your normal medications this day.  The cost of the testing is $99 due at the time of your appointment.  Follow-Up: At [facility], you and your health needs are our priority.  As part of our continuing mission to provide you with exceptional heart care, our providers are all part of one team.  This team includes your primary Cardiologist  (physician) and Advanced Practice Providers or APPs (Physician Assistants and Nurse Practitioners) who all work together to provide you with the care you need, when you need it.  Your next appointment:   3 - 5 year(s)  Provider:   Dr Oneil Parchment   We recommend signing up for the patient portal called MyChart.  Sign up information is provided on this After Visit Summary.  MyChart is used to connect with patients for Virtual Visits (Telemedicine).  Patients are able to view lab/test results, encounter notes, upcoming appointments, etc.  Non-urgent messages can be sent to your provider as well.   To learn more about what you can do with MyChart, go to [email or web address].

## 2024-03-10 ENCOUNTER — Ambulatory Visit (HOSPITAL_COMMUNITY)
